# Patient Record
Sex: Female | Born: 1995 | Race: Black or African American | Hispanic: No | Marital: Single | Smoking: Never smoker
Health system: Southern US, Community
[De-identification: ages and names within clinical notes are randomized; demographics above are authoritative.]

## PROBLEM LIST (undated history)

## (undated) DIAGNOSIS — E282 Polycystic ovarian syndrome: Secondary | ICD-10-CM

## (undated) HISTORY — DX: Polycystic ovarian syndrome: E28.2

## (undated) HISTORY — PX: NO PAST SURGERIES: SHX2092

## (undated) HISTORY — PX: TYMPANOSTOMY TUBE PLACEMENT: SHX32

---
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
## 2019-04-11 ENCOUNTER — Emergency Department (HOSPITAL_COMMUNITY)
Admission: EM | Admit: 2019-04-11 | Discharge: 2019-04-11 | Disposition: A | Discharge status: Home / Self Care | Payer: Medicaid Other | Attending: Emergency Medicine | Admitting: Emergency Medicine

## 2019-04-11 ENCOUNTER — Emergency Department (HOSPITAL_COMMUNITY): Payer: Medicaid Other

## 2019-04-11 ENCOUNTER — Other Ambulatory Visit: Payer: Self-pay

## 2019-04-11 ENCOUNTER — Encounter (HOSPITAL_COMMUNITY): Payer: Self-pay | Admitting: *Deleted

## 2019-04-11 DIAGNOSIS — U071 COVID-19: Secondary | ICD-10-CM | POA: Insufficient documentation

## 2019-04-11 DIAGNOSIS — Z20822 Contact with and (suspected) exposure to covid-19: Secondary | ICD-10-CM

## 2019-04-11 DIAGNOSIS — R0602 Shortness of breath: Secondary | ICD-10-CM | POA: Diagnosis present

## 2019-04-11 DIAGNOSIS — J45909 Unspecified asthma, uncomplicated: Secondary | ICD-10-CM | POA: Insufficient documentation

## 2019-04-11 NOTE — ED Provider Notes (Signed)
[facility]-EMERGENCY DEPT Provider Note   CSN: [id number] Arrival date & time: 04/11/19  1709     History Chief Complaint  Patient presents with  . Shortness of Breath    Elaine Gonzales is a 24 y.o. female does observe asthma who presents for evaluation of shortness of breath that has been ongoing for the last 2 to 3 days.  Patient states that she has also had some generalized body aches, fatigue, intermittent headache.  She has not noted any fevers.  She states she occasionally will have some chest tightness that occurs intermittently.  She denies any abdominal pain, nausea/vomiting, diarrhea.  She states she just feels like she has no energy.  She does report that her sister was recently diagnosed with COVID-19.  She started having symptoms this week and patient states she last saw her about 5 to 6 days ago.  Patient states she does have a history of asthma for which she uses albuterol inhaler.  She had to be admitted when she was a child but no recent admissions.  Never had been intubated.  Denies any smoking, history of COPD. She denies any OCP use, recent immobilization, prior history of DVT/PE, recent surgery, leg swelling, or long travel.  The history is provided by the patient.       Past Medical History:  Diagnosis Date  . Asthma     There are no problems to display for this patient.   Past Surgical History:  Procedure Laterality Date  . TYMPANOSTOMY TUBE PLACEMENT       OB History   No obstetric history on file.     No family history on file.  Social History   Tobacco Use  . Smoking status: Never Smoker  . Smokeless tobacco: Never Used  Substance Use Topics  . Alcohol use: Yes  . Drug use: Never    Home Medications Prior to Admission medications   Medication Sig Start Date End Date Taking? Authorizing Provider  albuterol (PROVENTIL HFA;VENTOLIN HFA) 108 (90 BASE) MCG/ACT inhaler Inhale 2 puffs into the lungs every 6 (six)  hours as needed. For breathing    [provider]  diphenhydrAMINE (BENADRYL) 25 MG tablet Take 1 tablet (25 mg total) by mouth every 6 (six) hours as needed for itching. 06/02/15   Barrett Henle, PA-C  fluticasone-salmeterol (ADVAIR HFA) [phone] MCG/ACT inhaler Inhale 2 puffs into the lungs 2 (two) times daily as needed (shortness of breath).    [provider]  hydrOXYzine (VISTARIL) 25 MG capsule Take 1 capsule (25 mg total) by mouth 3 (three) times daily as needed for itching. 06/02/15   Barrett Henle, PA-C  ipratropium (ATROVENT HFA) 17 MCG/ACT inhaler Inhale 2 puffs into the lungs every 6 (six) hours as needed for wheezing.    [provider]  traMADol (ULTRAM) 50 MG tablet Take 1 tablet (50 mg total) by mouth every 6 (six) hours as needed. 07/17/13   Pisciotta, Joni Reining, PA-C    Allergies    Amoxicillin  Review of Systems   Review of Systems  Constitutional: Positive for fatigue. Negative for fever.  Respiratory: Positive for chest tightness and shortness of breath. Negative for cough.   Cardiovascular: Negative for chest pain.  Gastrointestinal: Negative for abdominal pain, nausea and vomiting.  Musculoskeletal: Positive for myalgias.  Neurological: Positive for headaches.  All other systems reviewed and are negative.   Physical Exam Updated Vital Signs BP 137/86   Pulse 76   Temp 98.1  F (36.7 C) (Oral)   Resp (!) 22   Ht 5\' 3"  (1.6 m)   SpO2 97%   BMI 30.11 kg/m   Physical Exam Vitals and nursing note reviewed.  Constitutional:      Appearance: Normal appearance. She is well-developed.     Comments: Sitting comfortably on examination table  HENT:     Head: Normocephalic and atraumatic.  Eyes:     General: Lids are normal.     Conjunctiva/sclera: Conjunctivae normal.     Pupils: Pupils are equal, round, and reactive to light.  Cardiovascular:     Rate and Rhythm: Normal rate and regular rhythm.     Pulses: Normal  pulses.     Heart sounds: Normal heart sounds. No murmur. No friction rub. No gallop.   Pulmonary:     Effort: Pulmonary effort is normal.     Breath sounds: Normal breath sounds.     Comments: Lungs clear to auscultation bilaterally.  Symmetric chest rise.  No wheezing, rales, rhonchi.  No evidence of respiratory distress. Abdominal:     Palpations: Abdomen is soft. Abdomen is not rigid.     Tenderness: There is no abdominal tenderness. There is no guarding.     Comments: Abdomen is soft, non-distended, non-tender. No rigidity, No guarding. No peritoneal signs.  Musculoskeletal:        General: Normal range of motion.     Cervical back: Full passive range of motion without pain.  Skin:    General: Skin is warm and dry.     Capillary Refill: Capillary refill takes less than 2 seconds.  Neurological:     Mental Status: She is alert and oriented to person, place, and time.  Psychiatric:        Speech: Speech normal.     ED Results / Procedures / Treatments   Labs (all labs ordered are listed, but only abnormal results are displayed) Labs Reviewed  SARS CORONAVIRUS 2 (TAT 6-24 HRS)    EKG None  Radiology DG Chest 2 View  Result Date: 04/11/2019 CLINICAL DATA:  Shortness of breath. Exposure to COVID. EXAM: CHEST - 2 VIEW COMPARISON:  Chest x-ray dated 11/26/2009. FINDINGS: Heart size and mediastinal contours are within normal limits. Lungs are clear. No pleural effusion or pneumothorax is seen. Osseous structures about the chest are unremarkable. IMPRESSION: No evidence of acute cardiopulmonary abnormality. Lungs are clear. Electronically Signed   By: Franki Cabot M.D.   On: 04/11/2019 18:01    Procedures Procedures (including critical care time)  Medications Ordered in ED Medications - No data to display  ED Course  I have reviewed the triage vital signs and the nursing notes.  Pertinent labs & imaging results that were available during my care of the patient were  reviewed by me and considered in my medical decision making (see chart for details).    MDM Rules/Calculators/A&P                      24 year old female who presents for evaluation of 2 to 3 days of shortness of breath, generalized body aches, headache, fatigue. Recent COVID-19 exposure. On initially arrival, she is afebrile, nontoxic-appearing. Vital signs are stable. No evidence of hypoxia. Lungs clear to auscultation. She has no PE risk factors. Patient is PERC negative and is low risk for PE. Additionally, her story sounds atypical for ACS etiology. Question infectious process versus COVID-19. Plan for chest x-ray, Covid test.  X-rays reviewed. Negative for any  acute abnormalities.  I personally ambulated patient in the ED room while watching her O2 sats. Her O2 sats maintained between 94-96 on room air with any difficulty.   Discussed chest x-ray with patient. She does have history of asthma. Plan to put her on inhaler. I discussed with her that she has a COVID-19 test pending she should quarantine until it comes back. At this time, vitals are stable. No evidence of hypoxia. Patient stable for discharge. At this time, patient exhibits no emergent life-threatening condition that require further evaluation in ED or admission. Patient had ample opportunity for questions and discussion. All patient's questions were answered with full understanding. Strict return precautions discussed. Patient expresses understanding and agreement to plan.   Portions of this note were generated with Scientist, clinical (histocompatibility and immunogenetics). Dictation errors may occur despite best attempts at proofreading.   Final Clinical Impression(s) / ED Diagnoses Final diagnoses:  Shortness of breath  Suspected COVID-19 virus infection    Rx / DC Orders ED Discharge Orders    None       Rosana Hoes 04/11/19 2306    Rolan Bucco, MD 04/11/19 2342

## 2019-04-11 NOTE — ED Notes (Signed)
Pt ambulated around the room without difficulty and O2 saturations remained WNL.

## 2019-04-11 NOTE — Discharge Instructions (Signed)
As we discussed, your chest x-ray was reassuring.  He has a COVID-19 test pending. You need to quarantine until the test comes back. If you are positive, you will need to quarantine for 2 weeks.  If you are positive, make sure you are staying hydrated and drink plenty of fluids. You need to get plenty of rest.  You can take Tylenol or Ibuprofen as directed for pain. You can alternate Tylenol and Ibuprofen every 4 hours. If you take Tylenol at 1pm, then you can take Ibuprofen at 5pm. Then you can take Tylenol again at 9pm.   Closely monitor your symptoms return the emergency department for any worsening shortness of breath, chest pain, difficulty eating or drinking or any other worsening concerning symptoms.

## 2019-04-11 NOTE — ED Triage Notes (Signed)
Pt states she has been [facility] for 3-4 days, even reg resp in triage with 100% sat on RA

## 2019-04-12 LAB — SARS CORONAVIRUS 2 (TAT 6-24 HRS): SARS Coronavirus 2: POSITIVE — AB

## 2019-05-01 ENCOUNTER — Telehealth: Payer: Medicaid Other

## 2019-05-01 ENCOUNTER — Inpatient Hospital Stay: Admit: 2019-05-01 | Payer: Medicaid Other

## 2019-05-01 DIAGNOSIS — N912 Amenorrhea, unspecified: Secondary | ICD-10-CM | POA: Diagnosis not present

## 2019-05-01 DIAGNOSIS — R102 Pelvic and perineal pain: Secondary | ICD-10-CM | POA: Diagnosis not present

## 2019-05-01 MED ORDER — NAPROXEN 500 MG PO TABS: 500.0000 mg | ORAL_TABLET | Freq: Two times a day (BID) | ORAL | 0 refills | Status: DC

## 2019-05-01 NOTE — ED Provider Notes (Signed)
Virtual Visit via Video Note:  [name]  initiated request for Telemedicine visit with [facility]gent [facility] team. I connected with [name]  on 05/01/2019 at 3:56 PM  for a synchronized telemedicine visit using a video enabled HIPPA compliant telemedicine application. I verified that I am speaking with [name]  using two identifiers. Wallis Bamberg, PA-C  was physically located in a [facility] Urgent care site and Khila A Chim was located at a different location.   The limitations of evaluation and management by telemedicine as well as the availability of in-person appointments were discussed. Patient was informed that she  may incur a bill ( including co-pay) for this virtual visit encounter. [name]  expressed understanding and gave verbal consent to proceed with virtual visit.     History of Present Illness:Elaine Gonzales  is a 24 y.o. female presents with 2 month hx of persistent intermittent pelvic cramping. LMP was June or July 2020. Does not take contraception. Has never had difficulty with her cycles apart from being 1-2 days late.  Has taken multiple UPTs, was negative 2 months ago.   Review of Systems  Constitutional: Negative for chills and fever.  Respiratory: Negative for shortness of breath.   Cardiovascular: Negative for chest pain.  Gastrointestinal: Positive for abdominal pain (pelvic cramping). Negative for nausea and vomiting.  Genitourinary: Negative for dysuria, flank pain, frequency, hematuria and urgency.  Musculoskeletal: Negative for myalgias.  Skin: Negative for rash.  Neurological: Negative for dizziness and headaches.    No current facility-administered medications for this encounter.   Current Outpatient Medications  Medication Sig Dispense Refill  . albuterol (PROVENTIL HFA;VENTOLIN HFA) 108 (90 BASE) MCG/ACT inhaler Inhale 2 puffs into the lungs every 6 (six) hours as needed. For  breathing    . diphenhydrAMINE (BENADRYL) 25 MG tablet Take 1 tablet (25 mg total) by mouth every 6 (six) hours as needed for itching. 20 tablet 0  . fluticasone-salmeterol (ADVAIR HFA) 115-21 MCG/ACT inhaler Inhale 2 puffs into the lungs 2 (two) times daily as needed (shortness of breath).    . hydrOXYzine (VISTARIL) 25 MG capsule Take 1 capsule (25 mg total) by mouth 3 (three) times daily as needed for itching. 21 capsule 0  . ipratropium (ATROVENT HFA) 17 MCG/ACT inhaler Inhale 2 puffs into the lungs every 6 (six) hours as needed for wheezing.    . traMADol (ULTRAM) 50 MG tablet Take 1 tablet (50 mg total) by mouth every 6 (six) hours as needed. 15 tablet 0     Allergies  Allergen Reactions  . Amoxicillin Swelling     Past Medical History:  Diagnosis Date  . Asthma     Past Surgical History:  Procedure Laterality Date  . TYMPANOSTOMY TUBE PLACEMENT       Observations/Objective: Physical Exam Constitutional:      General: She is not in acute distress.    Appearance: Normal appearance. She is well-developed. She is not ill-appearing, toxic-appearing or diaphoretic.  Eyes:     Extraocular Movements: Extraocular movements intact.  Pulmonary:     Effort: Pulmonary effort is normal.  Neurological:     General: No focal deficit present.     Mental Status: She is alert and oriented to person, place, and time.  Psychiatric:        Mood and Affect: Mood normal.        Behavior: Behavior normal.        Thought Content: Thought content  normal.        Judgment: Judgment normal.      Assessment and Plan:  1. Amenorrhea   2. Pelvic cramping     Discussed differential and emphasized need for evaluation with her gynecologist to include imaging of pelvic and transvaginal ultrasound along with lab work and Pap smear.  Provided information to the women's outpatient clinic.  Patient verbalized understanding.  In the meantime she was agreeable to trying 1 more home pregnancy test.  If  negative she can use naproxen for her pelvic cramping.  Also recommended consideration for STI testing. Counseled patient on potential for adverse effects with medications prescribed/recommended today, ER and return-to-clinic precautions discussed, patient verbalized understanding.   Follow Up Instructions:    I discussed the assessment and treatment plan with the patient. The patient was provided an opportunity to ask questions and all were answered. The patient agreed with the plan and demonstrated an understanding of the instructions.   The patient was advised to call back or seek an in-person evaluation if the symptoms worsen or if the condition fails to improve as anticipated.  I provided 15 minutes of non-face-to-face time during this encounter.    Jaynee Eagles, PA-C  05/01/2019 3:56 PM         Jaynee Eagles, PA-C 05/01/19 1608

## 2019-05-07 ENCOUNTER — Ambulatory Visit (INDEPENDENT_AMBULATORY_CARE_PROVIDER_SITE_OTHER)
Admission: EM | Admit: 2019-05-07 | Discharge: 2019-05-07 | Disposition: A | Discharge status: Home / Self Care | Payer: Medicaid Other | Source: Home / Self Care

## 2019-05-07 ENCOUNTER — Ambulatory Visit
Admission: RE | Admit: 2019-05-07 | Discharge: 2019-05-07 | Disposition: A | Discharge status: Home / Self Care | Payer: Medicaid Other | Source: Ambulatory Visit

## 2019-05-07 DIAGNOSIS — N912 Amenorrhea, unspecified: Secondary | ICD-10-CM

## 2019-05-07 DIAGNOSIS — R102 Pelvic and perineal pain: Secondary | ICD-10-CM

## 2019-06-11 ENCOUNTER — Emergency Department (HOSPITAL_COMMUNITY): Payer: Medicaid Other

## 2019-06-11 ENCOUNTER — Emergency Department (HOSPITAL_COMMUNITY)
Admission: EM | Admit: 2019-06-11 | Discharge: 2019-06-11 | Disposition: A | Discharge status: Home / Self Care | Payer: Medicaid Other | Attending: Emergency Medicine | Admitting: Emergency Medicine

## 2019-06-11 ENCOUNTER — Encounter (HOSPITAL_COMMUNITY): Payer: Self-pay | Admitting: Emergency Medicine

## 2019-06-11 ENCOUNTER — Other Ambulatory Visit: Payer: Self-pay

## 2019-06-11 DIAGNOSIS — R079 Chest pain, unspecified: Secondary | ICD-10-CM | POA: Insufficient documentation

## 2019-06-11 DIAGNOSIS — Z79899 Other long term (current) drug therapy: Secondary | ICD-10-CM | POA: Diagnosis not present

## 2019-06-11 DIAGNOSIS — Z20822 Contact with and (suspected) exposure to covid-19: Secondary | ICD-10-CM | POA: Insufficient documentation

## 2019-06-11 LAB — CBC WITH DIFFERENTIAL/PLATELET
Abs Immature Granulocytes: 0.1 10*3/uL — ABNORMAL HIGH (ref 0.00–0.07)
Basophils Absolute: 0 10*3/uL (ref 0.0–0.1)
Basophils Relative: 0 %
Eosinophils Absolute: 0.2 10*3/uL (ref 0.0–0.5)
Eosinophils Relative: 1 %
HCT: 44.1 % (ref 36.0–46.0)
Hemoglobin: 13.3 g/dL (ref 12.0–15.0)
Immature Granulocytes: 1 %
Lymphocytes Relative: 7 %
Lymphs Abs: 1.2 10*3/uL (ref 0.7–4.0)
MCH: 26 pg (ref 26.0–34.0)
MCHC: 30.2 g/dL (ref 30.0–36.0)
MCV: 86.1 fL (ref 80.0–100.0)
Monocytes Absolute: 0.9 10*3/uL (ref 0.1–1.0)
Monocytes Relative: 5 %
Neutro Abs: 15.7 10*3/uL — ABNORMAL HIGH (ref 1.7–7.7)
Neutrophils Relative %: 86 %
Platelets: 328 10*3/uL (ref 150–400)
RBC: 5.12 MIL/uL — ABNORMAL HIGH (ref 3.87–5.11)
RDW: 13.7 % (ref 11.5–15.5)
WBC: 18.1 10*3/uL — ABNORMAL HIGH (ref 4.0–10.5)
nRBC: 0 % (ref 0.0–0.2)

## 2019-06-11 LAB — COMPREHENSIVE METABOLIC PANEL
ALT: 19 U/L (ref 0–44)
AST: 18 U/L (ref 15–41)
Albumin: 4 g/dL (ref 3.5–5.0)
Alkaline Phosphatase: 59 U/L (ref 38–126)
Anion gap: 11 (ref 5–15)
BUN: 16 mg/dL (ref 6–20)
CO2: 24 mmol/L (ref 22–32)
Calcium: 9.3 mg/dL (ref 8.9–10.3)
Chloride: 102 mmol/L (ref 98–111)
Creatinine, Ser: 0.89 mg/dL (ref 0.44–1.00)
GFR calc Af Amer: >60 mL/min (ref >60)
GFR calc non Af Amer: >60 mL/min (ref >60)
Glucose, Bld: 125 mg/dL — ABNORMAL HIGH (ref 70–99)
Potassium: 4 mmol/L (ref 3.5–5.1)
Sodium: 137 mmol/L (ref 135–145)
Total Bilirubin: 0.6 mg/dL (ref 0.3–1.2)
Total Protein: 8.1 g/dL (ref 6.5–8.1)

## 2019-06-11 LAB — URINALYSIS, ROUTINE W REFLEX MICROSCOPIC
Bilirubin Urine: NEGATIVE
Glucose, UA: NEGATIVE mg/dL
Hgb urine dipstick: NEGATIVE
Ketones, ur: NEGATIVE mg/dL
Leukocytes,Ua: NEGATIVE
Nitrite: NEGATIVE
Protein, ur: NEGATIVE mg/dL
Specific Gravity, Urine: 1.017 (ref 1.005–1.030)
pH: 6 (ref 5.0–8.0)

## 2019-06-11 LAB — D-DIMER, QUANTITATIVE: D-Dimer, Quant: 0.42 ug/mL-FEU (ref 0.00–0.50)

## 2019-06-11 LAB — LACTIC ACID, PLASMA: Lactic Acid, Venous: 1.9 mmol/L (ref 0.5–1.9)

## 2019-06-11 LAB — TROPONIN I (HIGH SENSITIVITY): Troponin I (High Sensitivity): <2 ng/L (ref <18)

## 2019-06-11 LAB — I-STAT BETA HCG BLOOD, ED (MC, WL, AP ONLY): I-stat hCG, quantitative: <5 m[IU]/mL (ref <5)

## 2019-06-11 LAB — SARS CORONAVIRUS 2 (TAT 6-24 HRS): SARS Coronavirus 2: NEGATIVE

## 2019-06-11 MED ORDER — FENTANYL CITRATE (PF) 100 MCG/2ML IJ SOLN
50.0000 ug | Freq: Once | INTRAMUSCULAR | Status: AC
  Administered 2019-06-11: 50 ug via INTRAVENOUS
  Filled 2019-06-11: qty 2

## 2019-06-11 MED ORDER — SODIUM CHLORIDE 0.9 % IV BOLUS
1000.0000 mL | Freq: Once | INTRAVENOUS | Status: AC
  Administered 2019-06-11: 1000 mL via INTRAVENOUS

## 2019-06-11 MED ORDER — ACETAMINOPHEN 325 MG PO TABS
650.0000 mg | ORAL_TABLET | Freq: Once | ORAL | Status: AC
  Administered 2019-06-11: 650 mg via ORAL
  Filled 2019-06-11: qty 2

## 2019-06-11 NOTE — ED Triage Notes (Signed)
Pt c/o chest tightness that started when she woke up this morning. Hx asthma.

## 2019-06-11 NOTE — Discharge Instructions (Signed)
Recommend follow-up with primary doctor regarding the symptoms you are experiencing today.  Discussed her elevated white blood cell count with them, likely need repeat blood work at some point in the next couple weeks.  Recommend following isolation precautions until we have the results of your COVID-19 test.  Return to ER if you develop worsening chest pain, difficulty breathing, fever, vomiting or other concerns symptom.

## 2019-06-11 NOTE — ED Provider Notes (Signed)
[facility]-EMERGENCY DEPT Provider Note   CSN: [id number] Arrival date & time: 06/11/19  0749     History Chief Complaint  Patient presents with  . Chest Pain    Elaine Gonzales is a 24 y.o. female.  Presents to ER chief complaint chest pain.  Yesterday, normal state of health, not have any symptoms.  Woke up early this morning having constant chest pain.  Pain is up to 9-10 severity, now 7 out of 10.  Occurring at rest, not associated with exertion.  Over the past few weeks she has not had any other chest pains.  She denies associated shortness of breath.  No cough, no fever at home, noted to be borderline febrile in ER.  No abdominal pain, no vomiting, no dysuria or hematuria.  Chart review, COVID-19 in January.  Has history of asthma, denies prior history CAD, hyperlipidemia, diabetes, hypertension, family history early CAD, DVT/PE.  HPI     Past Medical History:  Diagnosis Date  . Asthma     There are no problems to display for this patient.   Past Surgical History:  Procedure Laterality Date  . TYMPANOSTOMY TUBE PLACEMENT       OB History   No obstetric history on file.     No family history on file.  Social History   Tobacco Use  . Smoking status: Never Smoker  . Smokeless tobacco: Never Used  Substance Use Topics  . Alcohol use: Yes  . Drug use: Never    Home Medications Prior to Admission medications   Medication Sig Start Date End Date Taking? Authorizing Provider  acetaminophen (TYLENOL) 325 MG tablet Take 650 mg by mouth every 6 (six) hours as needed for mild pain or headache.   Yes [provider]  diphenhydrAMINE (BENADRYL) 25 MG tablet Take 1 tablet (25 mg total) by mouth every 6 (six) hours as needed for itching. Patient not taking: Reported on 06/11/2019 06/02/15   Barrett Henle, PA-C  hydrOXYzine (VISTARIL) 25 MG capsule Take 1 capsule (25 mg total) by mouth 3 (three) times daily as needed for  itching. Patient not taking: Reported on 06/11/2019 06/02/15   Barrett Henle, PA-C  naproxen (NAPROSYN) 500 MG tablet Take 1 tablet (500 mg total) by mouth 2 (two) times daily. Patient not taking: Reported on 06/11/2019 05/01/19   Wallis Bamberg, PA-C  traMADol (ULTRAM) 50 MG tablet Take 1 tablet (50 mg total) by mouth every 6 (six) hours as needed. Patient not taking: Reported on 06/11/2019 07/17/13   Pisciotta, Joni Reining, PA-C    Allergies    Penicillins and Amoxicillin  Review of Systems   Review of Systems  Constitutional: Negative for chills and fever.  HENT: Negative for ear pain and sore throat.   Eyes: Negative for pain and visual disturbance.  Respiratory: Negative for cough and shortness of breath.   Cardiovascular: Positive for chest pain. Negative for palpitations.  Gastrointestinal: Negative for abdominal pain and vomiting.  Genitourinary: Negative for dysuria and hematuria.  Musculoskeletal: Negative for arthralgias and back pain.  Skin: Negative for color change and rash.  Neurological: Negative for seizures and syncope.  All other systems reviewed and are negative.   Physical Exam Updated Vital Signs BP 120/70   Pulse (!) 112   Temp (!) 100.4 F (38 C) (Oral)   Resp (!) 22   Ht 5\' 3"  (1.6 m)   Wt (!) 136.2 kg   LMP 06/04/2019 (Approximate)   SpO2 100%  BMI 53.20 kg/m   Physical Exam Vitals and nursing note reviewed.  Constitutional:      General: She is not in acute distress.    Appearance: She is well-developed.  HENT:     Head: Normocephalic and atraumatic.  Eyes:     Conjunctiva/sclera: Conjunctivae normal.  Cardiovascular:     Rate and Rhythm: Regular rhythm. Tachycardia present.     Heart sounds: No murmur.  Pulmonary:     Effort: Pulmonary effort is normal. No respiratory distress.     Breath sounds: Normal breath sounds.  Abdominal:     Palpations: Abdomen is soft.     Tenderness: There is no abdominal tenderness.  Musculoskeletal:      Cervical back: Neck supple.  Skin:    General: Skin is warm and dry.     Capillary Refill: Capillary refill takes less than 2 seconds.  Neurological:     General: No focal deficit present.     Mental Status: She is alert and oriented to person, place, and time.     ED Results / Procedures / Treatments   Labs (all labs ordered are listed, but only abnormal results are displayed) Labs Reviewed  CBC WITH DIFFERENTIAL/PLATELET - Abnormal; Notable for the following components:      Result Value   WBC 18.1 (*)    RBC 5.12 (*)    Neutro Abs 15.7 (*)    Abs Immature Granulocytes 0.10 (*)    All other components within normal limits  COMPREHENSIVE METABOLIC PANEL - Abnormal; Notable for the following components:   Glucose, Bld 125 (*)    All other components within normal limits  CULTURE, BLOOD (ROUTINE X 2)  CULTURE, BLOOD (ROUTINE X 2)  SARS CORONAVIRUS 2 (TAT 6-24 HRS)  LACTIC ACID, PLASMA  D-DIMER, QUANTITATIVE (NOT AT [facility])  LACTIC ACID, PLASMA  URINALYSIS, ROUTINE W REFLEX MICROSCOPIC  I-STAT BETA HCG BLOOD, ED ([facility], [facility], [facility] ONLY)  TROPONIN I (HIGH SENSITIVITY)  TROPONIN I (HIGH SENSITIVITY)    EKG EKG Interpretation  Date/Time:  Thursday June 11 2019 08:04:05 EDT Ventricular Rate:  157 PR Interval:  96 QRS Duration: 72 QT Interval:  256 QTC Calculation: 413 R Axis:   58 Text Interpretation: Sinus tachycardia with short PR Septal infarct , age undetermined Abnormal ECG rate is faster compared to Jan 2021 Confirmed by Sherwood Gambler [phone]) on 06/11/2019 8:08:04 AM   Radiology DG Chest Port 1 View  Result Date: 06/11/2019 CLINICAL DATA:  Shortness of breath and chest tightness EXAM: PORTABLE CHEST 1 VIEW COMPARISON:  April 11, 2019 FINDINGS: Lungs are clear. Heart size and pulmonary vascularity are within normal limits. No adenopathy. No pneumothorax. No bone lesions. IMPRESSION: Lungs clear.  Stable cardiac silhouette. Electronically Signed   By: Lowella Grip  III M.D.   On: 06/11/2019 08:45    Procedures Procedures (including critical care time)  Medications Ordered in ED Medications  sodium chloride 0.9 % bolus 1,000 mL (1,000 mLs Intravenous New Bag/Given 06/11/19 0900)  acetaminophen (TYLENOL) tablet 650 mg (650 mg Oral Given 06/11/19 0855)  fentaNYL (SUBLIMAZE) injection 50 mcg (50 mcg Intravenous Given 06/11/19 0914)    ED Course  I have reviewed the triage vital signs and the nursing notes.  Pertinent labs & imaging results that were available during my care of the patient were reviewed by me and considered in my medical decision making (see chart for details).    MDM Rules/Calculators/A&P  24 year old female with past medical history asthma otherwise healthy presents to ER with chest pain.  Here patient noted to be well-appearing, but had borderline fever, significant tachycardia.  EKG showed sinus tachycardia, no acute ischemic changes were noted.  Troponin undetectable.  Given patient's description of symptoms, EKG and troponin findings, very low suspicion for ACS.  Given recent Covid and vital signs, checked D-dimer, within normal limits, no hypoxia or shortness of breath, therefore doubt pulmonary embolism.  CXR negative.  UA negative. Labs with leukocytosis but otherwise wnl. Patient was provided fluids, antipyretics, single dose fentanyl.  Her symptoms completely resolved and she remained symptom-free while observed in ER.  Suspect patient may have acute viral illness.  Though lower suspicion, will send Covid test.  Reviewed return precautions with patient, discharged home.  After the discussed management above, the patient was determined to be safe for discharge.  The patient was in agreement with this plan and all questions regarding their care were answered.  ED return precautions were discussed and the patient will return to the ED with any significant worsening of condition.  Final Clinical Impression(s) / ED  Diagnoses Final diagnoses:  Chest pain, unspecified type    Rx / DC Orders ED Discharge Orders    None       Milagros Loll, MD 06/11/19 1249

## 2019-06-16 LAB — CULTURE, BLOOD (ROUTINE X 2)
Culture: NO GROWTH
Culture: NO GROWTH
Special Requests: ADEQUATE

## 2019-10-24 ENCOUNTER — Ambulatory Visit (HOSPITAL_COMMUNITY): Payer: Self-pay

## 2019-10-26 ENCOUNTER — Ambulatory Visit: Payer: Self-pay

## 2019-12-03 ENCOUNTER — Ambulatory Visit: Payer: Self-pay

## 2019-12-17 ENCOUNTER — Ambulatory Visit: Payer: Self-pay

## 2019-12-30 ENCOUNTER — Ambulatory Visit: Payer: Medicaid Other | Admitting: Emergency Medicine

## 2019-12-30 ENCOUNTER — Ambulatory Visit: Payer: Self-pay

## 2019-12-31 ENCOUNTER — Encounter: Payer: Self-pay | Admitting: Emergency Medicine

## 2020-01-02 ENCOUNTER — Ambulatory Visit: Payer: Self-pay

## 2020-09-23 LAB — HM PAP SMEAR: HM Pap smear: NORMAL

## 2020-09-25 LAB — HM HEPATITIS C SCREENING LAB: HM Hepatitis Screen: NEGATIVE

## 2020-09-25 LAB — HM HIV SCREENING LAB: HM HIV Screening: NEGATIVE

## 2020-09-25 LAB — TSH: TSH: 3 (ref 0.41–5.90)

## 2020-09-25 LAB — HEMOGLOBIN A1C: Hemoglobin A1C: 5.8

## 2021-01-10 ENCOUNTER — Encounter: Payer: Medicaid Other | Admitting: Family Medicine

## 2021-04-12 ENCOUNTER — Telehealth: Payer: Medicaid Other | Admitting: Nurse Practitioner

## 2021-04-12 ENCOUNTER — Telehealth: Payer: Medicaid Other

## 2021-04-12 DIAGNOSIS — K047 Periapical abscess without sinus: Secondary | ICD-10-CM

## 2021-04-12 MED ORDER — IBUPROFEN 600 MG PO TABS: 600.0000 mg | ORAL_TABLET | Freq: Three times a day (TID) | ORAL | 0 refills | Status: DC | PRN

## 2021-04-12 MED ORDER — CLINDAMYCIN HCL 300 MG PO CAPS: 300.0000 mg | ORAL_CAPSULE | Freq: Three times a day (TID) | ORAL | 0 refills | Status: AC

## 2021-04-12 NOTE — Patient Instructions (Signed)
° ° °  It is imperative that you see a dentist within 10 days of this Visit to determine the cause of the dental pain and be sure it is adequately treated  A toothache or tooth pain is caused when the nerve in the root of a tooth or surrounding a tooth is irritated. Dental (tooth) infection, decay, injury, or loss of a tooth are the most common causes of dental pain. Pain may also occur after an extraction (tooth is pulled out). Pain sometimes originates from other areas and radiates to the jaw, thus appearing to be tooth pain.Bacteria growing inside your mouth can contribute to gum disease and dental decay, both of which can cause pain. A toothache occurs from inflammation of the central portion of the tooth called pulp. The pulp contains nerve endings that are very sensitive to pain. Inflammation to the pulp or pulpitis may be caused by dental cavities, trauma, and infection.    HOME CARE:   For toothaches: Over-the-counter pain medications such as acetaminophen or ibuprofen may be used. Take these as directed on the package while you arrange for a dental appointment. Avoid very cold or hot foods, because they may make the pain worse. You may get relief from biting on a cotton ball soaked in oil of cloves. You can get oil of cloves at most drug stores.  For jaw pain:  If pain happens every time you open your mouth widely, the temporomandibular joint (TMJ) may be the source of the pain. Yawning or taking a large bite of food may worsen the pain. An appointment with your doctor or dentist will help you find the cause.     GET HELP RIGHT AWAY IF:  You have a high fever or chills If you have had a recent head or face injury and develop headache, light headedness, nausea, vomiting, or other symptoms that concern you after an injury to your face or mouth, you could have a more serious injury in addition to your dental injury. A facial rash associated with a toothache: This condition may improve with  medication. Contact your doctor for them to decide what is appropriate. Any jaw pain occurring with chest pain: Although jaw pain is most commonly caused by dental disease, it is sometimes referred pain from other areas. People with heart disease, especially people who have had stents placed, people with diabetes, or those who have had heart surgery may have jaw pain as a symptom of heart attack or angina. If your jaw or tooth pain is associated with lightheadedness, sweating, or shortness of breath, you should see a doctor as soon as possible. Trouble swallowing or excessive pain or bleeding from gums: If you have a history of a weakened immune system, diabetes, or steroid use, you may be more susceptible to infections. Infections can often be more severe and extensive or caused by unusual organisms. Dental and gum infections in people with these conditions may require more aggressive treatment. An abscess may need draining or IV antibiotics, for example.  MAKE SURE YOU   Understand these instructions. Will watch your condition. Will get help right away if you are not doing well or get worse.

## 2021-04-12 NOTE — Progress Notes (Signed)
Virtual Visit Consent   [name], you are scheduled for a virtual visit with a [facility] provider today.     Just as with appointments in the office, your consent must be obtained to participate.  Your consent will be active for this visit and any virtual visit you may have with one of our providers in the next 365 days.     If you have a MyChart account, a copy of this consent can be sent to you electronically.  All virtual visits are billed to your insurance company just like a traditional visit in the office.    As this is a virtual visit, video technology does not allow for your provider to perform a traditional examination.  This may limit your provider's ability to fully assess your condition.  If your provider identifies any concerns that need to be evaluated in person or the need to arrange testing (such as labs, EKG, etc.), we will make arrangements to do so.     Although advances in technology are sophisticated, we cannot ensure that it will always work on either your end or our end.  If the connection with a video visit is poor, the visit may have to be switched to a telephone visit.  With either a video or telephone visit, we are not always able to ensure that we have a secure connection.     I need to obtain your verbal consent now.   Are you willing to proceed with your visit today?    Elaine Gonzales has provided verbal consent on 04/12/2021 for a virtual visit (video or telephone).   Viviano Simas, FNP   Date: 04/12/2021 7:30 PM   Virtual Visit via Video Note   I, Viviano Simas, connected with  Elaine Gonzales  ([id number], March 07, 2021) on 04/12/21 at  7:30 PM EST by a video-enabled telemedicine application and verified that I am speaking with the correct person using two identifiers.  Location: Patient: Virtual Visit Location Patient: Home Provider: Virtual Visit Location Provider: Home Office   I discussed the limitations of evaluation and  management by telemedicine and the availability of in person appointments. The patient expressed understanding and agreed to proceed.    History of Present Illness: Elaine Gonzales is a 26 y.o. who identifies as a female who was assigned female at birth, and is being seen today with complaints of jaw pain that radiates to her ear for the past 3 nights.  When she talks the pain increases.  She denies grinding her teeth.   She feels increased sensitivity when air hits her teeth or when she chews.  She denies a fever.   She has not been to a dentist in a "long time" Waiting for insurance to start on the first of next month   Problems: There are no problems to display for this patient.   Allergies:  Allergies  Allergen Reactions   Penicillins Anaphylaxis   Amoxicillin Swelling   Medications:  Current Outpatient Medications:    acetaminophen (TYLENOL) 325 MG tablet, Take 650 mg by mouth every 6 (six) hours as needed for mild pain or headache., Disp: , Rfl:    diphenhydrAMINE (BENADRYL) 25 MG tablet, Take 1 tablet (25 mg total) by mouth every 6 (six) hours as needed for itching. (Patient not taking: Reported on 06/11/2019), Disp: 20 tablet, Rfl: 0   hydrOXYzine (VISTARIL) 25 MG capsule, Take 1 capsule (25 mg total) by mouth 3 (three) times daily as needed for  itching. (Patient not taking: Reported on 06/11/2019), Disp: 21 capsule, Rfl: 0   naproxen (NAPROSYN) 500 MG tablet, Take 1 tablet (500 mg total) by mouth 2 (two) times daily. (Patient not taking: Reported on 06/11/2019), Disp: 30 tablet, Rfl: 0   traMADol (ULTRAM) 50 MG tablet, Take 1 tablet (50 mg total) by mouth every 6 (six) hours as needed. (Patient not taking: Reported on 06/11/2019), Disp: 15 tablet, Rfl: 0  Observations/Objective: Patient is well-developed, well-nourished in no acute distress.  Resting comfortably at home.  Head is normocephalic, atraumatic.  No labored breathing.  Speech is clear and coherent with logical  content.  Patient is alert and oriented at baseline.    Assessment and Plan: 1. Dental infection  - clindamycin (CLEOCIN) 300 MG capsule; Take 1 capsule (300 mg total) by mouth 3 (three) times daily for 10 days.  Dispense: 30 capsule; Refill: 0 - ibuprofen (ADVIL) 600 MG tablet; Take 1 tablet (600 mg total) by mouth every 8 (eight) hours as needed.  Dispense: 30 tablet; Refill: 0     Follow Up Instructions: I discussed the assessment and treatment plan with the patient. The patient was provided an opportunity to ask questions and all were answered. The patient agreed with the plan and demonstrated an understanding of the instructions.  A copy of instructions were sent to the patient via MyChart unless otherwise noted below.    The patient was advised to call back or seek an in-person evaluation if the symptoms worsen or if the condition fails to improve as anticipated.  Time:  I spent 10 minutes with the patient via telehealth technology discussing the above problems/concerns.    Viviano Simas, FNP

## 2021-05-03 IMAGING — DX DG CHEST 1V PORT
1 series · 1 of 1 positions shown · non-contrast
Comparison: April 11, 2019

CLINICAL DATA: Shortness of breath and chest tightness

EXAM:
PORTABLE CHEST 1 VIEW

[chest ap]
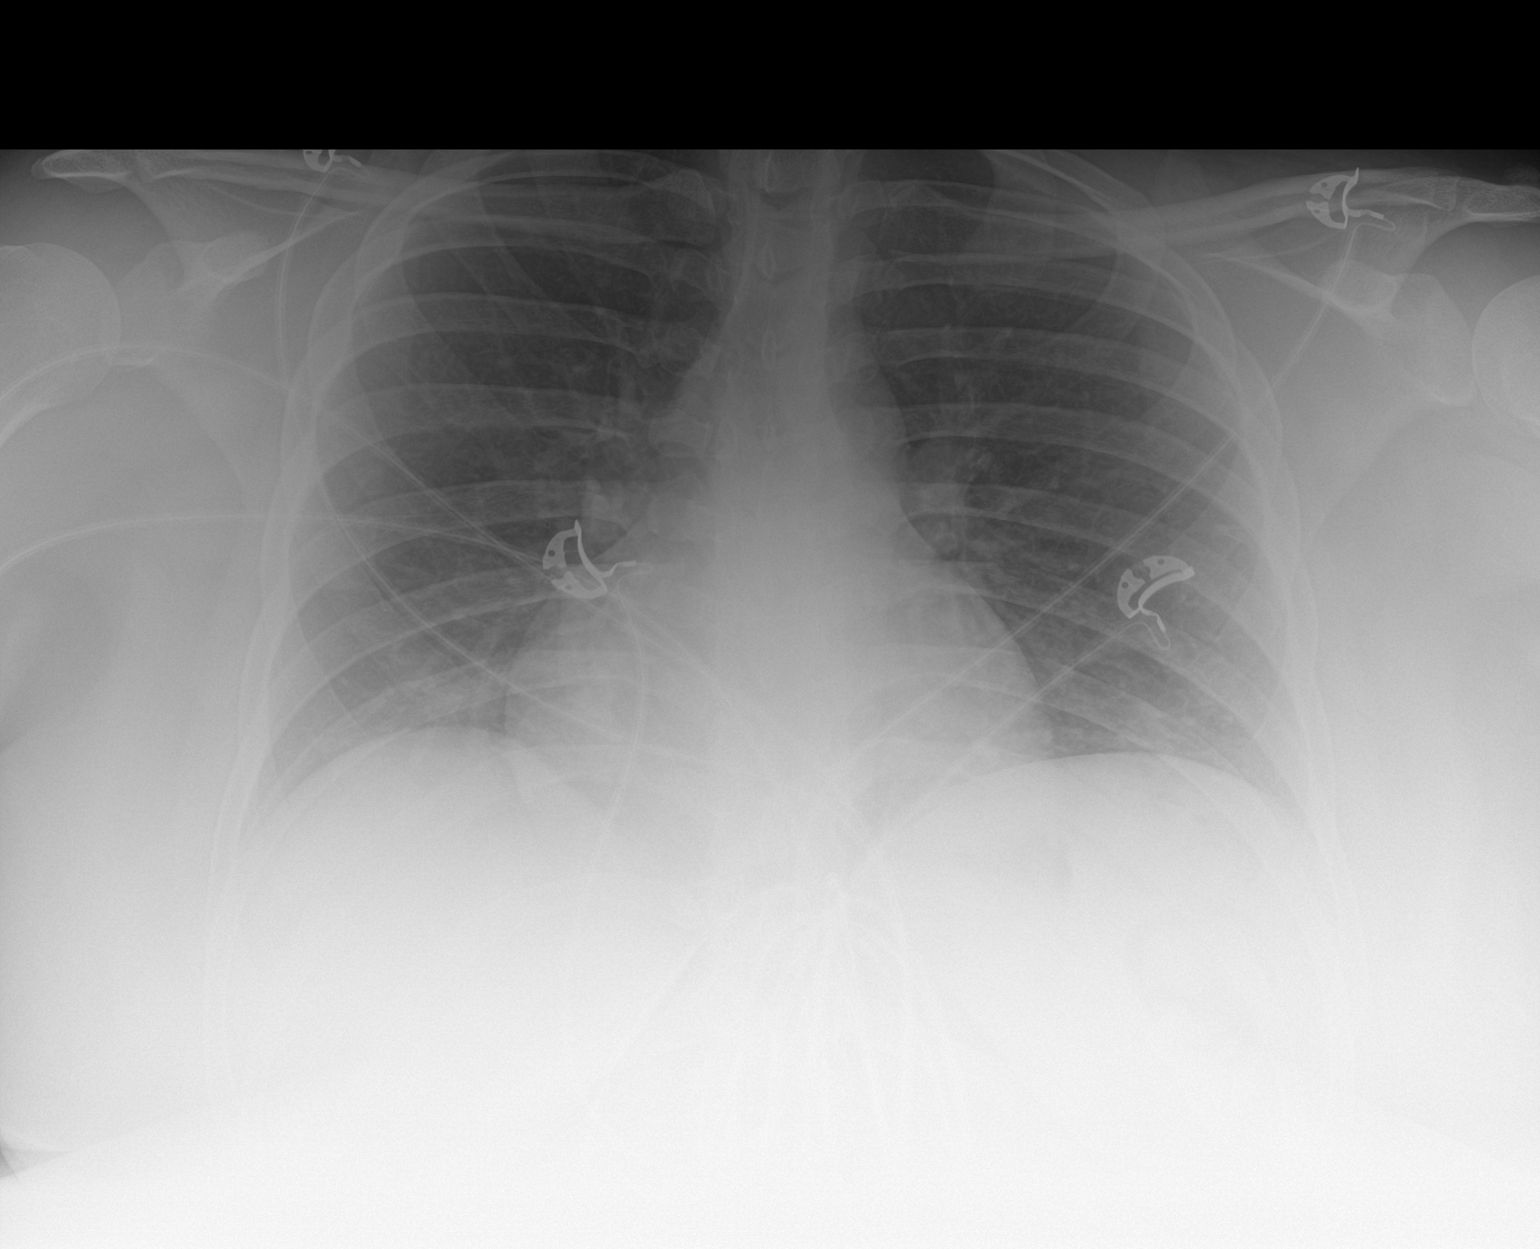

[1 of 1 positions shown; findings below may reference images not displayed]

FINDINGS: Lungs are clear. Heart size and pulmonary vascularity are within
normal limits. No adenopathy. No pneumothorax. No bone lesions.
IMPRESSION: Lungs clear.  Stable cardiac silhouette.

## 2021-06-09 ENCOUNTER — Encounter: Payer: Self-pay | Admitting: Obstetrics and Gynecology

## 2021-08-02 ENCOUNTER — Encounter: Payer: Managed Care, Other (non HMO) | Admitting: Radiology

## 2021-08-02 DIAGNOSIS — Z0289 Encounter for other administrative examinations: Secondary | ICD-10-CM

## 2021-08-08 NOTE — Progress Notes (Unsigned)
   New Patient Office Visit  Subjective    Patient ID: Elaine Gonzales, female    DOB: [birth year]  Age: 26 y.o. MRN: [id number]  CC: No chief complaint on file.   HPI Elaine Gonzales presents for new patient visit to establish care.  Introduced to Publishing rights manager role and practice setting.  All questions answered.  Discussed provider/patient relationship and expectations.   Outpatient Encounter Medications as of 08/09/2021  Medication Sig   acetaminophen (TYLENOL) 325 MG tablet Take 650 mg by mouth every 6 (six) hours as needed for mild pain or headache.   diphenhydrAMINE (BENADRYL) 25 MG tablet Take 1 tablet (25 mg total) by mouth every 6 (six) hours as needed for itching. (Patient not taking: Reported on 06/11/2019)   hydrOXYzine (VISTARIL) 25 MG capsule Take 1 capsule (25 mg total) by mouth 3 (three) times daily as needed for itching. (Patient not taking: Reported on 06/11/2019)   ibuprofen (ADVIL) 600 MG tablet Take 1 tablet (600 mg total) by mouth every 8 (eight) hours as needed.   naproxen (NAPROSYN) 500 MG tablet Take 1 tablet (500 mg total) by mouth 2 (two) times daily. (Patient not taking: Reported on 06/11/2019)   traMADol (ULTRAM) 50 MG tablet Take 1 tablet (50 mg total) by mouth every 6 (six) hours as needed. (Patient not taking: Reported on 06/11/2019)   No facility-administered encounter medications on file as of 08/09/2021.    Past Medical History:  Diagnosis Date   Asthma     Past Surgical History:  Procedure Laterality Date   TYMPANOSTOMY TUBE PLACEMENT      No family history on file.  Social History   Socioeconomic History   Marital status: Single    Spouse name: Not on file   Number of children: Not on file   Years of education: Not on file   Highest education level: Not on file  Occupational History   Not on file  Tobacco Use   Smoking status: Never   Smokeless tobacco: Never  Substance and Sexual Activity   Alcohol use: Yes   Drug  use: Never   Sexual activity: Not on file  Other Topics Concern   Not on file  Social History Narrative   Not on file   Social Determinants of Health   Financial Resource Strain: Not on file  Food Insecurity: Not on file  Transportation Needs: Not on file  Physical Activity: Not on file  Stress: Not on file  Social Connections: Not on file  Intimate Partner Violence: Not on file    ROS      Objective    There were no vitals taken for this visit.  Physical Exam  {Labs (Optional):23779}    Assessment & Plan:   Problem List Items Addressed This Visit   None   No follow-ups on file.   Gerre Scull, NP

## 2021-08-09 ENCOUNTER — Ambulatory Visit (INDEPENDENT_AMBULATORY_CARE_PROVIDER_SITE_OTHER): Payer: Managed Care, Other (non HMO) | Admitting: Nurse Practitioner

## 2021-08-09 ENCOUNTER — Encounter: Payer: Self-pay | Admitting: Nurse Practitioner

## 2021-08-09 VITALS — BP 133/67 | HR 78 | Temp 97.2°F | Ht 62.5 in | Wt 294.0 lb

## 2021-08-09 DIAGNOSIS — R7303 Prediabetes: Secondary | ICD-10-CM

## 2021-08-09 DIAGNOSIS — J4520 Mild intermittent asthma, uncomplicated: Secondary | ICD-10-CM

## 2021-08-09 DIAGNOSIS — Z13220 Encounter for screening for lipoid disorders: Secondary | ICD-10-CM | POA: Diagnosis not present

## 2021-08-09 DIAGNOSIS — Z136 Encounter for screening for cardiovascular disorders: Secondary | ICD-10-CM

## 2021-08-09 DIAGNOSIS — G5711 Meralgia paresthetica, right lower limb: Secondary | ICD-10-CM

## 2021-08-09 DIAGNOSIS — Z23 Encounter for immunization: Secondary | ICD-10-CM | POA: Diagnosis not present

## 2021-08-09 DIAGNOSIS — E282 Polycystic ovarian syndrome: Secondary | ICD-10-CM | POA: Diagnosis not present

## 2021-08-09 MED ORDER — GABAPENTIN 300 MG PO CAPS: 300.0000 mg | ORAL_CAPSULE | Freq: Every day | ORAL | 1 refills | Status: DC

## 2021-08-09 MED ORDER — ALBUTEROL SULFATE HFA 108 (90 BASE) MCG/ACT IN AERS: 2.0000 | INHALATION_SPRAY | Freq: Four times a day (QID) | RESPIRATORY_TRACT | 3 refills | Status: AC | PRN

## 2021-08-09 NOTE — Assessment & Plan Note (Signed)
Last A1c 09/25/2020 was 5.8%.  We will recheck A1c today

## 2021-08-09 NOTE — Assessment & Plan Note (Signed)
Chronic, ongoing for the last year.  However now the numbness and tingling is there constantly for the last 3 months.  She walks frequently and stands at work.  Discussed using a mask when she is standing at work and making sure she is wearing good shoes.  Discussed weight loss.  We will also start gabapentin 300 mg at bedtime.  Check vitamin B12 today.  Follow-up in 4 weeks.

## 2021-08-09 NOTE — Patient Instructions (Signed)
It was great to see you!  Let's start gabapentin 1 capsule at bedtime for the numbness in your leg (paraesthetica meralgia).   We are checking your labs today and will let you know the results via mychart/phone.   Let's follow-up in 4 weeks, sooner if you have concerns.  If a referral was placed today, you will be contacted for an appointment. Please note that routine referrals can sometimes take up to 3-4 weeks to process. Please call our office if you haven't heard anything after this time frame.  Take care,  Rodman Pickle, NP

## 2021-08-09 NOTE — Assessment & Plan Note (Signed)
Chronic, intermittent.  She states that it tends to worsen during the spring with pollen and if she gets an illness.  She is having some wheezing today on exam, she is just getting over a cold.  We will send in albuterol inhaler as needed for shortness of breath or wheezing.

## 2021-08-09 NOTE — Assessment & Plan Note (Signed)
She was diagnosed by her GYN about a year ago.  She was taking metformin 500 mg however it caused her severe diarrhea and she was unable to tolerate it.  Continue collaboration with GYN.

## 2021-08-10 LAB — COMPREHENSIVE METABOLIC PANEL
ALT: 19 U/L (ref 0–35)
AST: 20 U/L (ref 0–37)
Albumin: 4.3 g/dL (ref 3.5–5.2)
Alkaline Phosphatase: 51 U/L (ref 39–117)
BUN: 15 mg/dL (ref 6–23)
CO2: 25 mEq/L (ref 19–32)
Calcium: 9.7 mg/dL (ref 8.4–10.5)
Chloride: 103 mEq/L (ref 96–112)
Creatinine, Ser: 0.81 mg/dL (ref 0.40–1.20)
GFR: 101.71 mL/min (ref >60.00)
Glucose, Bld: 77 mg/dL (ref 70–99)
Potassium: 3.8 mEq/L (ref 3.5–5.1)
Sodium: 138 mEq/L (ref 135–145)
Total Bilirubin: 0.3 mg/dL (ref 0.2–1.2)
Total Protein: 7.7 g/dL (ref 6.0–8.3)

## 2021-08-10 LAB — CBC WITH DIFFERENTIAL/PLATELET
Basophils Absolute: 0.1 10*3/uL (ref 0.0–0.1)
Basophils Relative: 0.7 % (ref 0.0–3.0)
Eosinophils Absolute: 0.6 10*3/uL (ref 0.0–0.7)
Eosinophils Relative: 9.2 % — ABNORMAL HIGH (ref 0.0–5.0)
HCT: 38.3 % (ref 36.0–46.0)
Hemoglobin: 12.2 g/dL (ref 12.0–15.0)
Lymphocytes Relative: 34.4 % (ref 12.0–46.0)
Lymphs Abs: 2.4 10*3/uL (ref 0.7–4.0)
MCHC: 31.9 g/dL (ref 30.0–36.0)
MCV: 81.4 fl (ref 78.0–100.0)
Monocytes Absolute: 0.6 10*3/uL (ref 0.1–1.0)
Monocytes Relative: 8.9 % (ref 3.0–12.0)
Neutro Abs: 3.3 10*3/uL (ref 1.4–7.7)
Neutrophils Relative %: 46.8 % (ref 43.0–77.0)
Platelets: 299 10*3/uL (ref 150.0–400.0)
RBC: 4.7 Mil/uL (ref 3.87–5.11)
RDW: 13.8 % (ref 11.5–15.5)
WBC: 7 10*3/uL (ref 4.0–10.5)

## 2021-08-10 LAB — LIPID PANEL
Cholesterol: 205 mg/dL — ABNORMAL HIGH (ref 0–200)
HDL: 55.6 mg/dL (ref >39.00)
LDL Cholesterol: 134 mg/dL — ABNORMAL HIGH (ref 0–99)
NonHDL: 149.72
Total CHOL/HDL Ratio: 4
Triglycerides: 77 mg/dL (ref 0.0–149.0)
VLDL: 15.4 mg/dL (ref 0.0–40.0)

## 2021-08-10 LAB — HEMOGLOBIN A1C: Hgb A1c MFr Bld: 6 % (ref 4.6–6.5)

## 2021-08-10 LAB — VITAMIN B12: Vitamin B-12: 288 pg/mL (ref 211–911)

## 2021-08-17 ENCOUNTER — Encounter: Payer: Self-pay | Admitting: Nurse Practitioner

## 2021-08-25 ENCOUNTER — Encounter: Payer: Managed Care, Other (non HMO) | Admitting: Radiology

## 2021-08-29 ENCOUNTER — Encounter: Payer: Managed Care, Other (non HMO) | Admitting: Radiology

## 2021-08-29 DIAGNOSIS — Z0289 Encounter for other administrative examinations: Secondary | ICD-10-CM

## 2021-09-11 ENCOUNTER — Encounter: Payer: Managed Care, Other (non HMO) | Admitting: Radiology

## 2021-09-13 ENCOUNTER — Ambulatory Visit (INDEPENDENT_AMBULATORY_CARE_PROVIDER_SITE_OTHER): Payer: Managed Care, Other (non HMO) | Admitting: Nurse Practitioner

## 2021-09-13 ENCOUNTER — Encounter: Payer: Self-pay | Admitting: Nurse Practitioner

## 2021-09-13 VITALS — BP 158/85 | HR 98 | Temp 97.1°F | Wt 292.6 lb

## 2021-09-13 DIAGNOSIS — M545 Low back pain, unspecified: Secondary | ICD-10-CM | POA: Diagnosis not present

## 2021-09-13 DIAGNOSIS — G5711 Meralgia paresthetica, right lower limb: Secondary | ICD-10-CM | POA: Diagnosis not present

## 2021-09-13 MED ORDER — NAPROXEN 500 MG PO TABS: 500.0000 mg | ORAL_TABLET | Freq: Two times a day (BID) | ORAL | 0 refills | Status: AC

## 2021-09-13 MED ORDER — PREGABALIN 75 MG PO CAPS: 75.0000 mg | ORAL_CAPSULE | Freq: Two times a day (BID) | ORAL | 0 refills | Status: AC

## 2021-09-13 NOTE — Progress Notes (Signed)
Established Patient Office Visit  Subjective   Patient ID: Elaine Gonzales, female    DOB: 1995/07/19  Age: 26 y.o. MRN: [id number]  Chief Complaint  Patient presents with   Follow-up    4 wk f/u. Pt c/o persistent RT leg pain w/ burning sensation if standing too long. Pt states gabapentin did not help and made her extremely sleepy     HPI  Elaine Gonzales is here today to follow up on parasthetica meralgia   She states that she is still having ongoing right outer thigh numbness that is constant.  Sometimes it will start to burn.  She tried the gabapentin which did not help her symptoms and made her very sleepy.  She stopped taking this medication.  She has also started having low midline back pain that does not radiate.  She rates it as an 8/10 today.  She states that walking makes the pain worse along with laying certain ways.  She takes ibuprofen which helps for short period of time.  She denies fevers, incontinence.   Past Medical History:  Diagnosis Date   Asthma    PCOS (polycystic ovarian syndrome)    Past Surgical History:  Procedure Laterality Date   TYMPANOSTOMY TUBE PLACEMENT      ROS See pertinent positives and negatives per HPI.    Objective:     BP (!) 158/85   Pulse 98   Temp (!) 97.1 F (36.2 C) (Temporal)   Wt 292 lb 9.6 oz (132.7 kg)   SpO2 100%   BMI 52.66 kg/m    Physical Exam Vitals and nursing note reviewed.  Constitutional:      General: She is not in acute distress.    Appearance: Normal appearance.  HENT:     Head: Normocephalic.  Eyes:     Conjunctiva/sclera: Conjunctivae normal.  Pulmonary:     Effort: Pulmonary effort is normal.  Musculoskeletal:        General: No swelling or tenderness. Normal range of motion.     Cervical back: Normal range of motion.  Skin:    General: Skin is warm.  Neurological:     General: No focal deficit present.     Mental Status: She is alert and oriented to person, place, and  time.  Psychiatric:        Mood and Affect: Mood normal.        Behavior: Behavior normal.        Thought Content: Thought content normal.        Judgment: Judgment normal.      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  She states that the gabapentin did not help her at all, just made her sleepy so she stopped taking it.  Discussed weight loss.  We will try Lyrica 75 mg daily to see if she tolerates this better and if it helps.  PDMP reviewed.  We will also place referral to orthopedics.      Relevant Medications   pregabalin (LYRICA) 75 MG capsule   Other Relevant Orders   Ambulatory referral to Orthopedic Surgery     Other   Acute midline low back pain without sciatica    Acute midline low back pain.  No red flags on exam.  Stretches printed and given to patient to do daily.  We will  start her on naproxen 500 mg twice a day.  Follow-up in 4 weeks.      Relevant Medications   naproxen (NAPROSYN) 500 MG tablet    Return in about 3 months (around 12/14/2021) for CPE.    Elaine Scull, NP

## 2021-09-13 NOTE — Patient Instructions (Addendum)
It was great to see you!  Stop the gabapentin. Start lyrica 1 capsule daily. You can take this in the morning at night with or without food.   Start naproxen twice a day with food for your back. I am also attaching stretches for your back that you can do daily.   I have placed a referral to orthopedics.   Let's follow-up in 3 months, sooner if you have concerns.  If a referral was placed today, you will be contacted for an appointment. Please note that routine referrals can sometimes take up to 3-4 weeks to process. Please call our office if you haven't heard anything after this time frame.  Take care,  Rodman Pickle, NP

## 2021-09-13 NOTE — Assessment & Plan Note (Signed)
Acute midline low back pain.  No red flags on exam.  Stretches printed and given to patient to do daily.  We will start her on naproxen 500 mg twice a day.  Follow-up in 4 weeks.

## 2021-09-13 NOTE — Assessment & Plan Note (Signed)
Chronic, ongoing.  She states that the gabapentin did not help her at all, just made her sleepy so she stopped taking it.  Discussed weight loss.  We will try Lyrica 75 mg daily to see if she tolerates this better and if it helps.  PDMP reviewed.  We will also place referral to orthopedics.

## 2021-09-18 ENCOUNTER — Ambulatory Visit: Payer: Managed Care, Other (non HMO) | Admitting: Physician Assistant

## 2021-09-22 ENCOUNTER — Encounter: Payer: Managed Care, Other (non HMO) | Admitting: Radiology

## 2021-10-06 ENCOUNTER — Encounter: Payer: Managed Care, Other (non HMO) | Admitting: Radiology

## 2021-12-14 ENCOUNTER — Telehealth: Payer: Self-pay | Admitting: Nurse Practitioner

## 2021-12-14 ENCOUNTER — Encounter: Payer: Managed Care, Other (non HMO) | Admitting: Nurse Practitioner

## 2021-12-14 NOTE — Telephone Encounter (Signed)
Pt was a no show for a cpe with Lauren on 12/14/21,I sent a letter.

## 2021-12-15 NOTE — Telephone Encounter (Signed)
1st no show for cpe, fee waived, letter sent

## 2021-12-15 NOTE — Telephone Encounter (Signed)
noted 

## 2022-02-23 ENCOUNTER — Encounter: Payer: Self-pay | Admitting: Obstetrics and Gynecology

## 2022-05-08 ENCOUNTER — Encounter: Payer: Self-pay | Admitting: Family Medicine

## 2022-05-08 ENCOUNTER — Telehealth: Payer: BC Managed Care – PPO | Admitting: Family Medicine

## 2022-05-08 DIAGNOSIS — J029 Acute pharyngitis, unspecified: Secondary | ICD-10-CM | POA: Diagnosis not present

## 2022-05-08 MED ORDER — AZITHROMYCIN 250 MG PO TABS: ORAL_TABLET | ORAL | 0 refills | Status: AC

## 2022-05-08 MED ORDER — IBUPROFEN 600 MG PO TABS: 600.0000 mg | ORAL_TABLET | Freq: Three times a day (TID) | ORAL | 0 refills | Status: AC | PRN

## 2022-05-08 MED ORDER — LIDOCAINE VISCOUS HCL 2 % MT SOLN: 15.0000 mL | OROMUCOSAL | 0 refills | Status: AC | PRN

## 2022-05-08 MED ORDER — PSEUDOEPH-BROMPHEN-DM 30-2-10 MG/5ML PO SYRP: 5.0000 mL | ORAL_SOLUTION | Freq: Four times a day (QID) | ORAL | 0 refills | Status: AC | PRN

## 2022-05-08 NOTE — Patient Instructions (Addendum)
Elaine Gonzales, thank you for joining Perlie Mayo, NP for today's virtual visit.  While this provider is not your primary care provider (PCP), if your PCP is located in our provider database this encounter information will be shared with them immediately following your visit.   [facility] account gives you access to today's visit and all your visits, tests, and labs performed at [facility] " click here if you don't have a [facility] account or go to [email or web address]  Consent: (Patient) Elaine Gonzales provided verbal consent for this virtual visit at the beginning of the encounter.  Current Medications:  Current Outpatient Medications:    brompheniramine-pseudoephedrine-DM 30-2-10 MG/5ML syrup, Take 5 mLs by mouth 4 (four) times daily as needed., Disp: 120 mL, Rfl: 0   ibuprofen (ADVIL) 600 MG tablet, Take 1 tablet (600 mg total) by mouth every 8 (eight) hours as needed., Disp: 30 tablet, Rfl: 0   lidocaine (XYLOCAINE) 2 % solution, Use as directed 15 mLs in the mouth or throat as needed for mouth pain., Disp: 100 mL, Rfl: 0   albuterol (VENTOLIN HFA) 108 (90 Base) MCG/ACT inhaler, Inhale 2 puffs into the lungs every 6 (six) hours as needed for wheezing or shortness of breath., Disp: 8 g, Rfl: 3   naproxen (NAPROSYN) 500 MG tablet, Take 1 tablet (500 mg total) by mouth 2 (two) times daily with a meal., Disp: 30 tablet, Rfl: 0   pregabalin (LYRICA) 75 MG capsule, Take 1 capsule (75 mg total) by mouth 2 (two) times daily., Disp: 30 capsule, Rfl: 0   Medications ordered in this encounter:  Meds ordered this encounter  Medications   lidocaine (XYLOCAINE) 2 % solution    Sig: Use as directed 15 mLs in the mouth or throat as needed for mouth pain.    Dispense:  100 mL    Refill:  0    Order Specific Question:   Supervising Provider    Answer:   Chase Picket [id number]   ibuprofen (ADVIL) 600 MG tablet    Sig: Take 1 tablet (600 mg  total) by mouth every 8 (eight) hours as needed.    Dispense:  30 tablet    Refill:  0    Order Specific Question:   Supervising Provider    Answer:   Chase Picket [id number]   brompheniramine-pseudoephedrine-DM 30-2-10 MG/5ML syrup    Sig: Take 5 mLs by mouth 4 (four) times daily as needed.    Dispense:  120 mL    Refill:  0    Order Specific Question:   Supervising Provider    Answer:   Chase Picket [id number]     *If you need refills on other medications prior to your next appointment, please contact your pharmacy*  Follow-Up: Call back or seek an in-person evaluation if the symptoms worsen or if the condition fails to improve as anticipated.  [facility] [phone]  Other Instructions  Pharyngitis  Pharyngitis is a sore throat (pharynx). This is when there is redness, pain, and swelling in your throat. Most of the time, this condition gets better on its own. In some cases, you may need medicine. What are the causes? An infection from a virus. An infection from bacteria. Allergies. What increases the risk? Being 72-34 years old. Being in crowded environments. These include: Daycares. Schools. Dormitories. Living in a place with cold temperatures outside. Having a weakened disease-fighting (immune) system. What are the signs or  symptoms? Symptoms may vary depending on the cause. Common symptoms include: Sore throat. Tiredness (fatigue). Low-grade fever. Stuffy nose. Cough. Headache. Other symptoms may include: Glands in the neck (lymph nodes) that are swollen. Skin rashes. Film on the throat or tonsils. This can be caused by an infection from bacteria. Vomiting. Red, itchy eyes. Loss of appetite. Joint pain and muscle aches. Tonsils that are temporarily bigger than usual (enlarged). How is this treated? Many times, treatment is not needed. This condition usually gets better in 3-4 days without treatment. If the infection is caused by a  bacteria, you may be need to take antibiotics. Follow these instructions at home: Medicines Take over-the-counter and prescription medicines only as told by your doctor. If you were prescribed an antibiotic medicine, take it as told by your doctor. Do not stop taking the antibiotic even if you start to feel better. Use throat lozenges or sprays to soothe your throat as told by your doctor. Children can get pharyngitis. Do not give your child aspirin. Managing pain To help with pain, try: Sipping warm liquids, such as: Broth. Herbal tea. Warm water. Eating or drinking cold or frozen liquids, such as frozen ice pops. Rinsing your mouth (gargle) with a salt water mixture 3-4 times a day or as needed. To make salt water, dissolve -1 tsp (3-6 g) of salt in 1 cup (237 mL) of warm water. Do not swallow this mixture. Sucking on hard candy or throat lozenges. Putting a cool-mist humidifier in your bedroom at night to moisten the air. Sitting in the bathroom with the door closed for 5-10 minutes while you run hot water in the shower.  General instructions  Do not smoke or use any products that contain nicotine or tobacco. If you need help quitting, ask your doctor. Rest as told by your doctor. Drink enough fluid to keep your pee (urine) pale yellow. How is this prevented? Wash your hands often for at least 20 seconds with soap and water. If soap and water are not available, use hand sanitizer. Do not touch your eyes, nose, or mouth with unwashed hands. Wash hands after touching these areas. Do not share cups or eating utensils. Avoid close contact with people who are sick. Contact a doctor if: You have large, tender lumps in your neck. You have a rash. You cough up green, yellow-brown, or bloody spit. Get help right away if: You have a stiff neck. You drool or cannot swallow liquids. You cannot drink or take medicines without vomiting. You have very bad pain that does not go away with  medicine. You have problems breathing, and it is not from a stuffy nose. You have new pain and swelling in your knees, ankles, wrists, or elbows. These symptoms may be an emergency. Get help right away. Call your local emergency services (911 in the [place] Do not wait to see if the symptoms will go away. Do not drive yourself to the hospital. Summary Pharyngitis is a sore throat (pharynx). This is when there is redness, pain, and swelling in your throat. Most of the time, pharyngitis gets better on its own. Sometimes, you may need medicine. If you were prescribed an antibiotic medicine, take it as told by your doctor. Do not stop taking the antibiotic even if you start to feel better. This information is not intended to replace advice given to you by your health care provider. Make sure you discuss any questions you have with your health care provider. Document Revised: 06/01/2020 Document Reviewed:  06/01/2020 Elsevier Patient Education  [place].     If you have been instructed to have an in-person evaluation today at a local Urgent Care facility, please use the link below. It will take you to a list of all of our available [facility], including address, phone number and hours of operation. Please do not delay care.  [facility]  If you or a family member do not have a primary care provider, use the link below to schedule a visit and establish care. When you choose a [facility] primary care physician or advanced practice provider, you gain a long-term partner in health. Find a Primary Care Provider  Learn more about [facility]'s in-office and virtual care options: [facility]

## 2022-05-08 NOTE — Progress Notes (Signed)
Virtual Visit Consent   Elaine Gonzales, you are scheduled for a virtual visit with a [facility] provider today. Just as with appointments in the office, your consent must be obtained to participate. Your consent will be active for this visit and any virtual visit you may have with one of our providers in the next 365 days. If you have a MyChart account, a copy of this consent can be sent to you electronically.  As this is a virtual visit, video technology does not allow for your provider to perform a traditional examination. This may limit your provider's ability to fully assess your condition. If your provider identifies any concerns that need to be evaluated in person or the need to arrange testing (such as labs, EKG, etc.), we will make arrangements to do so. Although advances in technology are sophisticated, we cannot ensure that it will always work on either your end or our end. If the connection with a video visit is poor, the visit may have to be switched to a telephone visit. With either a video or telephone visit, we are not always able to ensure that we have a secure connection.  By engaging in this virtual visit, you consent to the provision of healthcare and authorize for your insurance to be billed (if applicable) for the services provided during this visit. Depending on your insurance coverage, you may receive a charge related to this service.  I need to obtain your verbal consent now. Are you willing to proceed with your visit today? Elaine Gonzales has provided verbal consent on 05/08/2022 for a virtual visit (video or telephone). Perlie Mayo, NP  Date: 05/08/2022 3:41 PM  Virtual Visit via Video Note   I, Perlie Mayo, connected with  Elaine Gonzales  ([id number], 04-14-1995) on 05/08/22 at  3:45 PM EST by a video-enabled telemedicine application and verified that I am speaking with the correct person using two identifiers.  Location: Patient: Virtual Visit  Location Patient: Home Provider: Virtual Visit Location Provider: Home Office   I discussed the limitations of evaluation and management by telemedicine and the availability of in person appointments. The patient expressed understanding and agreed to proceed.    History of Present Illness: Elaine Gonzales is a 27 y.o. who identifies as a female who was assigned female at birth, and is being seen today for   Onset was yesterday with sore throat Associated symptoms are swelling and redness in throat, ear pain, trouble swallowing. Feverish feeling- did not check Modifying factors are ibuprofen -little to no relief Denies chest pain, shortness of breath, fevers, chills Exposure to sick contacts- unknown COVID test: not tested Vaccines: flu shot, no booster covid  Problems:  Patient Active Problem List   Diagnosis Date Noted   Acute midline low back pain without sciatica 09/13/2021   Mild intermittent asthma without complication 123456   PCOS (polycystic ovarian syndrome) 08/09/2021   Prediabetes 08/09/2021   Meralgia paresthetica of right side 08/09/2021    Allergies:  Allergies  Allergen Reactions   Penicillins Anaphylaxis   Amoxicillin Swelling   Medications:  Current Outpatient Medications:    albuterol (VENTOLIN HFA) 108 (90 Base) MCG/ACT inhaler, Inhale 2 puffs into the lungs every 6 (six) hours as needed for wheezing or shortness of breath., Disp: 8 g, Rfl: 3   naproxen (NAPROSYN) 500 MG tablet, Take 1 tablet (500 mg total) by mouth 2 (two) times daily with a meal., Disp: 30 tablet, Rfl: 0  pregabalin (LYRICA) 75 MG capsule, Take 1 capsule (75 mg total) by mouth 2 (two) times daily., Disp: 30 capsule, Rfl: 0  Observations/Objective: Patient is well-developed, well-nourished in no acute distress.  Resting comfortably  at home.  Head is normocephalic, atraumatic.  No labored breathing.  Speech is clear and coherent with logical content.  Patient is alert and  oriented at baseline.  Thick voice- but clear  Assessment and Plan:  1. Pharyngitis, unspecified etiology  - lidocaine (XYLOCAINE) 2 % solution; Use as directed 15 mLs in the mouth or throat as needed for mouth pain.  Dispense: 100 mL; Refill: 0 - ibuprofen (ADVIL) 600 MG tablet; Take 1 tablet (600 mg total) by mouth every 8 (eight) hours as needed.  Dispense: 30 tablet; Refill: 0 - brompheniramine-pseudoephedrine-DM 30-2-10 MG/5ML syrup; Take 5 mLs by mouth 4 (four) times daily as needed.  Dispense: 120 mL; Refill: 0 - azithromycin (ZITHROMAX) 250 MG tablet; Take 2 tablets on day 1, then 1 tablet daily on days 2 through 5  Dispense: 6 tablet; Refill: 0  -meds as above- pharyngitis vs tonsillitis  -strict in person precautions -salt water gargles -ibuprofen as need   Reviewed side effects, risks and benefits of medication.    Patient acknowledged agreement and understanding of the plan.   Past Medical, Surgical, Social History, Allergies, and Medications have been Reviewed.      Follow Up Instructions: I discussed the assessment and treatment plan with the patient. The patient was provided an opportunity to ask questions and all were answered. The patient agreed with the plan and demonstrated an understanding of the instructions.  A copy of instructions were sent to the patient via MyChart unless otherwise noted below.    The patient was advised to call back or seek an in-person evaluation if the symptoms worsen or if the condition fails to improve as anticipated.  Time:  I spent 10 minutes with the patient via telehealth technology discussing the above problems/concerns.    Perlie Mayo, NP

## 2022-06-04 ENCOUNTER — Telehealth: Payer: Medicaid Other | Admitting: Physician Assistant

## 2022-06-04 ENCOUNTER — Telehealth: Payer: Medicaid Other

## 2022-06-04 DIAGNOSIS — R051 Acute cough: Secondary | ICD-10-CM | POA: Diagnosis not present

## 2022-06-04 MED ORDER — FLUTICASONE PROPIONATE 50 MCG/ACT NA SUSP: 2.0000 | Freq: Every day | NASAL | 6 refills | Status: AC

## 2022-06-04 MED ORDER — BENZONATATE 100 MG PO CAPS: 100.0000 mg | ORAL_CAPSULE | Freq: Three times a day (TID) | ORAL | 0 refills | Status: AC | PRN

## 2022-06-04 NOTE — Patient Instructions (Signed)
Elaine Gonzales, thank you for joining Lenise Arena Ward, PA-C for today's virtual visit.  While this provider is not your primary care provider (PCP), if your PCP is located in our provider database this encounter information will be shared with them immediately following your visit.   [facility] account gives you access to today's visit and all your visits, tests, and labs performed at [facility] " click here if you don't have a [facility] account or go to [email or web address]  Consent: (Patient) Elaine Gonzales provided verbal consent for this virtual visit at the beginning of the encounter.  Current Medications:  Current Outpatient Medications:    benzonatate (TESSALON) 100 MG capsule, Take 1 capsule (100 mg total) by mouth 3 (three) times daily as needed., Disp: 20 capsule, Rfl: 0   fluticasone (FLONASE) 50 MCG/ACT nasal spray, Place 2 sprays into both nostrils daily., Disp: 16 g, Rfl: 6   albuterol (VENTOLIN HFA) 108 (90 Base) MCG/ACT inhaler, Inhale 2 puffs into the lungs every 6 (six) hours as needed for wheezing or shortness of breath., Disp: 8 g, Rfl: 3   brompheniramine-pseudoephedrine-DM 30-2-10 MG/5ML syrup, Take 5 mLs by mouth 4 (four) times daily as needed., Disp: 120 mL, Rfl: 0   ibuprofen (ADVIL) 600 MG tablet, Take 1 tablet (600 mg total) by mouth every 8 (eight) hours as needed., Disp: 30 tablet, Rfl: 0   lidocaine (XYLOCAINE) 2 % solution, Use as directed 15 mLs in the mouth or throat as needed for mouth pain., Disp: 100 mL, Rfl: 0   naproxen (NAPROSYN) 500 MG tablet, Take 1 tablet (500 mg total) by mouth 2 (two) times daily with a meal., Disp: 30 tablet, Rfl: 0   pregabalin (LYRICA) 75 MG capsule, Take 1 capsule (75 mg total) by mouth 2 (two) times daily., Disp: 30 capsule, Rfl: 0   Medications ordered in this encounter:  Meds ordered this encounter  Medications   benzonatate (TESSALON) 100 MG capsule    Sig: Take 1  capsule (100 mg total) by mouth 3 (three) times daily as needed.    Dispense:  20 capsule    Refill:  0    Order Specific Question:   Supervising Provider    Answer:   Chase Picket [id number]   fluticasone (FLONASE) 50 MCG/ACT nasal spray    Sig: Place 2 sprays into both nostrils daily.    Dispense:  16 g    Refill:  6    Order Specific Question:   Supervising Provider    Answer:   Chase Picket [id number]     *If you need refills on other medications prior to your next appointment, please contact your pharmacy*  Follow-Up: Call back or seek an in-person evaluation if the symptoms worsen or if the condition fails to improve as anticipated.  [facility] [phone]  Other Instructions Recommend daily allergy medication like Zyrtec or Claritin.  Use Flonase once daily.  Use Tessalon as needed for cough.  Use inhaler as needed for shortness of breath or wheezing.   If you have been instructed to have an in-person evaluation today at a local Urgent Care facility, please use the link below. It will take you to a list of all of our available [facility], including address, phone number and hours of operation. Please do not delay care.  [facility]  If you or a family member do not have a primary care provider, use  the link below to schedule a visit and establish care. When you choose a [facility] primary care physician or advanced practice provider, you gain a long-term partner in health. Find a Primary Care Provider  Learn more about [facility]'s in-office and virtual care options: [facility]

## 2022-06-04 NOTE — Progress Notes (Signed)
Virtual Visit Consent   Clare Charon, you are scheduled for a virtual visit with a [facility] provider today. Just as with appointments in the office, your consent must be obtained to participate. Your consent will be active for this visit and any virtual visit you may have with one of our providers in the next 365 days. If you have a MyChart account, a copy of this consent can be sent to you electronically.  As this is a virtual visit, video technology does not allow for your provider to perform a traditional examination. This may limit your provider's ability to fully assess your condition. If your provider identifies any concerns that need to be evaluated in person or the need to arrange testing (such as labs, EKG, etc.), we will make arrangements to do so. Although advances in technology are sophisticated, we cannot ensure that it will always work on either your end or our end. If the connection with a video visit is poor, the visit may have to be switched to a telephone visit. With either a video or telephone visit, we are not always able to ensure that we have a secure connection.  By engaging in this virtual visit, you consent to the provision of healthcare and authorize for your insurance to be billed (if applicable) for the services provided during this visit. Depending on your insurance coverage, you may receive a charge related to this service.  I need to obtain your verbal consent now. Are you willing to proceed with your visit today? SKYLYN BILINSKI has provided verbal consent on 06/04/2022 for a virtual visit (video or telephone). Lenise Arena Ward, PA-C  Date: 06/04/2022 5:18 PM  Virtual Visit via Video Note   I, Lenise Arena Ward, connected with  Elaine Gonzales  ([id number], [birth year]) on 06/04/22 at  5:15 PM EDT by a video-enabled telemedicine application and verified that I am speaking with the correct person using two identifiers.  Location: Patient:  home Provider: Virtual Visit Location Provider: Home Office   I discussed the limitations of evaluation and management by telemedicine and the availability of in person appointments. The patient expressed understanding and agreed to proceed.    History of Present Illness: Elaine Gonzales is a 27 y.o. who identifies as a female who was assigned female at birth, and is being seen today for cough that started about one week.  Denies congestion or postnasal.  She has a h/o asthma, reports she has not had to use her inhaler.  She has tried a cough syrup.  She denies fever, chills, wheezing, or shortness breath.  HPI: HPI  Problems:  Patient Active Problem List   Diagnosis Date Noted   Acute midline low back pain without sciatica 09/13/2021   Mild intermittent asthma without complication 123456   PCOS (polycystic ovarian syndrome) 08/09/2021   Prediabetes 08/09/2021   Meralgia paresthetica of right side 08/09/2021    Allergies:  Allergies  Allergen Reactions   Penicillins Anaphylaxis   Amoxicillin Swelling   Medications:  Current Outpatient Medications:    benzonatate (TESSALON) 100 MG capsule, Take 1 capsule (100 mg total) by mouth 3 (three) times daily as needed., Disp: 20 capsule, Rfl: 0   fluticasone (FLONASE) 50 MCG/ACT nasal spray, Place 2 sprays into both nostrils daily., Disp: 16 g, Rfl: 6   albuterol (VENTOLIN HFA) 108 (90 Base) MCG/ACT inhaler, Inhale 2 puffs into the lungs every 6 (six) hours as needed for wheezing or shortness of breath., Disp: 8 g,  Rfl: 3   brompheniramine-pseudoephedrine-DM 30-2-10 MG/5ML syrup, Take 5 mLs by mouth 4 (four) times daily as needed., Disp: 120 mL, Rfl: 0   ibuprofen (ADVIL) 600 MG tablet, Take 1 tablet (600 mg total) by mouth every 8 (eight) hours as needed., Disp: 30 tablet, Rfl: 0   lidocaine (XYLOCAINE) 2 % solution, Use as directed 15 mLs in the mouth or throat as needed for mouth pain., Disp: 100 mL, Rfl: 0   naproxen (NAPROSYN)  500 MG tablet, Take 1 tablet (500 mg total) by mouth 2 (two) times daily with a meal., Disp: 30 tablet, Rfl: 0   pregabalin (LYRICA) 75 MG capsule, Take 1 capsule (75 mg total) by mouth 2 (two) times daily., Disp: 30 capsule, Rfl: 0  Observations/Objective: Patient is well-developed, well-nourished in no acute distress.  Resting comfortably at home.  Head is normocephalic, atraumatic.  No labored breathing.  Speech is clear and coherent with logical content.  Patient is alert and oriented at baseline.    Assessment and Plan: 1. Acute cough - benzonatate (TESSALON) 100 MG capsule; Take 1 capsule (100 mg total) by mouth 3 (three) times daily as needed.  Dispense: 20 capsule; Refill: 0 - fluticasone (FLONASE) 50 MCG/ACT nasal spray; Place 2 sprays into both nostrils daily.  Dispense: 16 g; Refill: 6  Pt with no fever, chills, congestion, wheezing, shortness of breath.  Cough may be allergies vs asthma.  Advised daily allergy meds and inhaler as needed.  Speaking in complete sentences, in no acute distress.  Precautions for in person evaluation discussed.   Follow Up Instructions: I discussed the assessment and treatment plan with the patient. The patient was provided an opportunity to ask questions and all were answered. The patient agreed with the plan and demonstrated an understanding of the instructions.  A copy of instructions were sent to the patient via MyChart unless otherwise noted below.     The patient was advised to call back or seek an in-person evaluation if the symptoms worsen or if the condition fails to improve as anticipated.  Time:  I spent 6 minutes with the patient via telehealth technology discussing the above problems/concerns.    Lenise Arena Ward, PA-C

## 2022-06-06 ENCOUNTER — Encounter: Payer: Self-pay | Admitting: Obstetrics and Gynecology

## 2022-06-06 ENCOUNTER — Ambulatory Visit (INDEPENDENT_AMBULATORY_CARE_PROVIDER_SITE_OTHER): Payer: Medicaid Other | Admitting: Obstetrics and Gynecology

## 2022-06-06 ENCOUNTER — Encounter: Payer: Self-pay | Admitting: Emergency Medicine

## 2022-06-06 ENCOUNTER — Other Ambulatory Visit (HOSPITAL_COMMUNITY)
Admission: RE | Admit: 2022-06-06 | Discharge: 2022-06-06 | Disposition: A | Discharge status: Home / Self Care | Payer: Medicaid Other | Source: Ambulatory Visit | Attending: Obstetrics and Gynecology | Admitting: Obstetrics and Gynecology

## 2022-06-06 VITALS — BP 142/89 | HR 83 | Ht 63.0 in | Wt 296.0 lb

## 2022-06-06 DIAGNOSIS — Z01419 Encounter for gynecological examination (general) (routine) without abnormal findings: Secondary | ICD-10-CM

## 2022-06-06 DIAGNOSIS — E282 Polycystic ovarian syndrome: Secondary | ICD-10-CM | POA: Diagnosis not present

## 2022-06-06 DIAGNOSIS — Z1339 Encounter for screening examination for other mental health and behavioral disorders: Secondary | ICD-10-CM | POA: Diagnosis not present

## 2022-06-06 DIAGNOSIS — Z6841 Body Mass Index (BMI) 40.0 and over, adult: Secondary | ICD-10-CM | POA: Diagnosis not present

## 2022-06-06 MED ORDER — MEDROXYPROGESTERONE ACETATE 10 MG PO TABS: 10.0000 mg | ORAL_TABLET | Freq: Every day | ORAL | 2 refills | Status: AC

## 2022-06-06 MED ORDER — LETROZOLE 2.5 MG PO TABS: 2.5000 mg | ORAL_TABLET | Freq: Every day | ORAL | 2 refills | Status: AC

## 2022-06-06 MED ORDER — METFORMIN HCL 500 MG PO TABS: ORAL_TABLET | ORAL | 5 refills | Status: AC

## 2022-06-06 NOTE — Progress Notes (Signed)
27 y.o New GYN presents for AEX/PAP/STD screening.  Pt wants to know why she has not conceived, they have been trying for 1 yr.

## 2022-06-06 NOTE — Progress Notes (Signed)
Subjective:     Elaine Gonzales is a 27 y.o. female P0 with LMP 04/28/22 and BMI 52 who is here for a comprehensive physical exam. The patient reports irregular menses. She reports amenorrhea for 2 years and oligomenorrhea for the past 6 months. She was diagnosed with PCOS and prescribed provera and metformin 2 years ago. She is actively seeking a pregnancy. She denies pelvic pain or abnormal discharge. She reports that she and her partner are otherwise healthy and non smokers. She is without any other complaints.   Past Medical History:  Diagnosis Date   Asthma    PCOS (polycystic ovarian syndrome)    Past Surgical History:  Procedure Laterality Date   TYMPANOSTOMY TUBE PLACEMENT     Family History  Problem Relation Age of Onset   Diabetes Mother    Cancer Mother        unsure which kind    Social History   Socioeconomic History   Marital status: Single    Spouse name: Not on file   Number of children: Not on file   Years of education: Not on file   Highest education level: Not on file  Occupational History   Not on file  Tobacco Use   Smoking status: Never   Smokeless tobacco: Never  Vaping Use   Vaping Use: Never used  Substance and Sexual Activity   Alcohol use: Yes    Comment: rarely   Drug use: Never   Sexual activity: Yes    Birth control/protection: None  Other Topics Concern   Not on file  Social History Narrative   Not on file   Social Determinants of Health   Financial Resource Strain: Not on file  Food Insecurity: Not on file  Transportation Needs: Not on file  Physical Activity: Not on file  Stress: Not on file  Social Connections: Not on file  Intimate Partner Violence: Not on file   Health Maintenance  Topic Date Due   HPV VACCINES (3 - 2-dose series) 09/13/2008   INFLUENZA VACCINE  Never done   PAP-Cervical Cytology Screening  09/24/2023   PAP SMEAR-Modifier  09/24/2023   DTaP/Tdap/Td (2 - Td or Tdap) 08/10/2031   Hepatitis C  Screening  Completed   HIV Screening  Completed   COVID-19 Vaccine  Discontinued       Review of Systems Pertinent items noted in HPI and remainder of comprehensive ROS otherwise negative.   Objective:  Blood pressure (!) 143/94, pulse 94, height 5\' 3"  (1.6 m), weight 296 lb (134.3 kg), last menstrual period 04/28/2022.   GENERAL: Well-developed, well-nourished female in no acute distress.  HEENT: Normocephalic, atraumatic. Sclerae anicteric.  NECK: Supple. Normal thyroid.  LUNGS: Clear to auscultation bilaterally.  HEART: Regular rate and rhythm. BREASTS: Symmetric in size. No palpable masses or lymphadenopathy, skin changes, or nipple drainage. ABDOMEN: Soft, nontender, nondistended. No organomegaly. PELVIC: Normal external female genitalia. Vagina is pink and rugated.  Normal discharge. Normal appearing cervix. Uterus is normal in size. No adnexal mass or tenderness. Chaperone present during the pelvic exam EXTREMITIES: No cyanosis, clubbing, or edema, 2+ distal pulses.     Assessment:    Healthy female exam.      Plan:    Pap smear collected STI screening per patient request Long discussion regarding diagnosis of PCOS and its implication on overall health and menstrual cycle. Discussed benefits of weight loss and patient was referred to a nutritionist Patient will be contacted with abnormal results Rx metformin provided  Patient advised to focus on weight loss management and improving overall health prior to conception (HTN and pre-diabetes 2 years ago) See After Visit Summary for Counseling Recommendations

## 2022-06-07 LAB — CERVICOVAGINAL ANCILLARY ONLY
Chlamydia: NEGATIVE
Comment: NEGATIVE
Comment: NORMAL
Neisseria Gonorrhea: NEGATIVE

## 2022-06-07 LAB — HIV ANTIBODY (ROUTINE TESTING W REFLEX): HIV Screen 4th Generation wRfx: NONREACTIVE

## 2022-06-07 LAB — HEPATITIS B SURFACE ANTIGEN: Hepatitis B Surface Ag: NEGATIVE

## 2022-06-07 LAB — RPR: RPR Ser Ql: NONREACTIVE

## 2022-06-07 LAB — CYTOLOGY - PAP: Diagnosis: NEGATIVE

## 2022-06-07 LAB — HEPATITIS C ANTIBODY: Hep C Virus Ab: NONREACTIVE

## 2022-07-16 ENCOUNTER — Encounter: Payer: Self-pay | Admitting: Registered"

## 2022-07-16 ENCOUNTER — Encounter: Payer: Medicaid Other | Attending: Obstetrics and Gynecology | Admitting: Registered"

## 2022-07-16 DIAGNOSIS — E282 Polycystic ovarian syndrome: Secondary | ICD-10-CM

## 2022-07-16 NOTE — Patient Instructions (Signed)
Increase omega 3 fish 3 times per week Salmon, tuna, sardines (with bones). Tilapia ok to mix it up, but doesn't have as much omega 3 Plant-based sources Walnuts, chia seeds, flaxseeds  Avoid skipping meals, consider having fruit and/or vegetables for snacks instead of chips or crackers.  No soda or juice  Sample meals: Salmon, asparagus, pasta Baked or sauteed chicken breast, green beans, brown rice other whole grain (less than 1 cup) Meatballs, pasta (whole grain), salad  Breakfast ideas: (Consider having dinner before 7 pm) (Lactaide if lactose intolerant or A2 milk of it is the protein causing digestive issue) Eggs, whole toast Cheerios and [place] yogurt with less than 10g sugar Left overs Peanut butter toast High protein protein shake

## 2022-07-16 NOTE — Progress Notes (Signed)
Medical Nutrition Therapy  Appointment Start time:  [phone]  Appointment End time:  34  Primary concerns today: improve diet to address HTN, PCOS symptoms and preconception health  Referral diagnosis: PCOS Preferred learning style:  no preference indicated Learning readiness: ready, change in progress  NUTRITION ASSESSMENT  Anthropometrics  Wt Readings from Last 3 Encounters:  06/06/22 296 lb (134.3 kg)  09/13/21 292 lb 9.6 oz (132.7 kg)  08/09/21 294 lb (133.4 kg)   Clinical Medical Hx: PCOS, HTN, low vitamin D Medications: metformin 500 mg with lunch Labs: LDL 134 Lab Results  Component Value Date   HGBA1C 6.0 08/09/2021   Notable Signs/Symptoms: weight gain, irregular cycles, reduced energy  Lifestyle & Dietary Hx Pt states she remember taking a high dose vitamin D for deficiency. PT getting some sun exposure when she walks outside on work breaks.  Pt states when she goes to sleep at 10 when feeling sleepy is able to get better sleep, but if stays up until midnight has a harder time falling asleep.  Pt reports she thinks she is lactose intolerant because any type of dairy bothers her including cheese and sour cream. Pt may have milk protein intolerance/allergy. A2 milk may be better tolerated.  Pt states states for lunch she might have cereal, sandwich or salad or ~1x/week skips lunch and will just have a snack. Pt states she usually eats chicken, rice, vegetables during the week. Pt states she doesn't eat treats on a regular basis.   Estimated daily fluid intake: 32-48 oz water, juice, soda,  Supplements: none Sleep: 6 hr wakes up 3 throughout, sometimes awake 10-15 min;  Stress / self-care: low stress / self-care includes music, games, talking to friends, family Current average weekly physical activity: ADL  24-Hr Dietary Recall (Sunday) First Meal: none Snack: none Second Meal: eggs, pancakes, sausage, orange juice Snack: chips Third Meal: burger and fries, ginger  ale Snack: none Beverages: alcohol 1x week, water, 12 oz soda 2-3 x/week, 2x week coffee cream & sugar  Estimated Energy Needs Calories: not assessed  NUTRITION DIAGNOSIS  NB-1.1 Food and nutrition-related knowledge deficit As related to importance of protein at breakfast.  As evidenced by dietary recall, pt states skips breakfast sometimes.  NUTRITION INTERVENTION  Nutrition education (E-1) on the following topics:  PCOS pathophysiology Insulin & glucose role in blood sugar control A1c MyPlate Omega 3  Handouts Provided Include  Nutrition Facts Labels A1c chart  Learning Style & Readiness for Change Teaching method utilized: Visual & Auditory  Demonstrated degree of understanding via: Teach Back  Barriers to learning/adherence to lifestyle change: none  Goals Established by Pt Increase omega 3 fish 3 times per week Salmon, tuna, sardines (with bones). Tilapia ok to mix it up, but doesn't have as much omega 3 Plant-based sources Walnuts, chia seeds, flaxseeds  Avoid skipping meals, consider having fruit and/or vegetables for snacks instead of chips or crackers.  No soda or juice  Sample meals: Salmon, asparagus, pasta Baked or sauteed chicken breast, green beans, brown rice other whole grain (less than 1 cup) Meatballs, pasta (whole grain), salad  Breakfast ideas: (Consider having dinner before 7 pm) (Lactaide if lactose intolerant or A2 milk of it is the protein causing digestive issue) Eggs, whole toast Cheerios and [place] yogurt with less than 10g sugar Left overs Peanut butter toast High protein protein shake  MONITORING & EVALUATION Dietary intake, weekly physical activity, and sleep in 2 months.

## 2022-07-17 ENCOUNTER — Encounter: Payer: Self-pay | Admitting: Obstetrics and Gynecology

## 2022-07-22 ENCOUNTER — Encounter: Payer: Self-pay | Admitting: Nurse Practitioner

## 2022-08-03 ENCOUNTER — Ambulatory Visit: Payer: Medicaid Other | Admitting: Nurse Practitioner

## 2022-08-08 ENCOUNTER — Ambulatory Visit: Payer: Medicaid Other | Admitting: Nurse Practitioner

## 2022-08-16 ENCOUNTER — Ambulatory Visit: Payer: Medicaid Other | Admitting: Nurse Practitioner

## 2022-08-22 ENCOUNTER — Ambulatory Visit: Payer: Medicaid Other | Admitting: Registered"

## 2022-08-27 ENCOUNTER — Encounter: Payer: Self-pay | Admitting: Obstetrics and Gynecology

## 2022-08-29 ENCOUNTER — Ambulatory Visit: Payer: Medicaid Other | Admitting: Nurse Practitioner

## 2022-08-29 ENCOUNTER — Telehealth: Payer: Self-pay | Admitting: Nurse Practitioner

## 2022-08-29 ENCOUNTER — Ambulatory Visit (INDEPENDENT_AMBULATORY_CARE_PROVIDER_SITE_OTHER): Payer: Medicaid Other | Admitting: Plastic Surgery

## 2022-08-29 ENCOUNTER — Encounter: Payer: Self-pay | Admitting: Plastic Surgery

## 2022-08-29 ENCOUNTER — Telehealth: Payer: Medicaid Other

## 2022-08-29 VITALS — BP 139/82 | HR 92

## 2022-08-29 DIAGNOSIS — L910 Hypertrophic scar: Secondary | ICD-10-CM | POA: Diagnosis not present

## 2022-08-29 NOTE — Progress Notes (Signed)
Referring Provider Gerre Scull, NP [address] [place],  [place] [address]   CC:  Chief Complaint  Patient presents with   Advice Only      Elaine Gonzales is an 27 y.o. female.  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  The keloid has been there for several years and she says it is growing in size.  It is now so large that it pulls the ear over.  She would like to have it removed.  Allergies  Allergen Reactions   Penicillins Anaphylaxis   Amoxicillin Swelling    Outpatient Encounter Medications as of 08/29/2022  Medication Sig   albuterol (VENTOLIN HFA) 108 (90 Base) MCG/ACT inhaler Inhale 2 puffs into the lungs every 6 (six) hours as needed for wheezing or shortness of breath.   benzonatate (TESSALON) 100 MG capsule Take 1 capsule (100 mg total) by mouth 3 (three) times daily as needed.   brompheniramine-pseudoephedrine-DM 30-2-10 MG/5ML syrup Take 5 mLs by mouth 4 (four) times daily as needed.   cholecalciferol (VITAMIN D3) 25 MCG (1000 UNIT) tablet Take 1,000 Units by mouth daily.   fluticasone (FLONASE) 50 MCG/ACT nasal spray Place 2 sprays into both nostrils daily.   ibuprofen (ADVIL) 600 MG tablet Take 1 tablet (600 mg total) by mouth every 8 (eight) hours as needed.   letrozole (FEMARA) 2.5 MG tablet Take 1 tablet (2.5 mg total) by mouth daily. Take on days 3 to 7 following a spontaneous menses or progestin-induced bleed.   lidocaine (XYLOCAINE) 2 % solution Use as directed 15 mLs in the mouth or throat as needed for mouth pain.   medroxyPROGESTERone (PROVERA) 10 MG tablet Take 1 tablet (10 mg total) by mouth daily. Use for ten days   metFORMIN (GLUCOPHAGE) 500 MG tablet Take one tablet by mouth daily for one week. Then increase to one tablet twice a day for one week.  Then two tablets twice a day.   naproxen (NAPROSYN) 500 MG tablet Take 1 tablet (500 mg total) by mouth 2 (two) times daily  with a meal.   pregabalin (LYRICA) 75 MG capsule Take 1 capsule (75 mg total) by mouth 2 (two) times daily.   No facility-administered encounter medications on file as of 08/29/2022.     Past Medical History:  Diagnosis Date   Asthma    PCOS (polycystic ovarian syndrome)     Past Surgical History:  Procedure Laterality Date   NO PAST SURGERIES     TYMPANOSTOMY TUBE PLACEMENT      Family History  Problem Relation Age of Onset   Diabetes Mother    Cancer Mother        unsure which kind    Social History   Social History Narrative   Not on file     Review of Systems General: Denies fevers, chills, weight loss CV: Denies chest pain, shortness of breath, palpitations Skin: 2 cm keloid on the left helical rim  Physical Exam    08/29/2022   11:08 AM 06/06/2022    9:36 AM 06/06/2022    9:29 AM  Vitals with BMI  Height   5\' 3"   Weight   296 lbs  BMI   52.45  Systolic 139 142 604  Diastolic 82 89 94  Pulse 92 83 94    General:  No acute distress,  Alert and oriented, Non-Toxic, Normal speech and affect Skin: 2 cm keloid on  the left helical rim. Mammogram: Not applicable Assessment/Plan Keloid: Patient has a keloid which would be amenable to resection in the office.  We did discuss recurrence rates and I told her that I felt that the best chance of preventing recurrence was with low-dose radiation therapy after the resection.  She seems interested in this.  Will schedule her for resection of the keloid and will place a consult to radiation oncology for evaluation.  Photographs were obtained today with her consent.  [name] 08/29/2022, 4:39 PM

## 2022-08-29 NOTE — Telephone Encounter (Signed)
2nd no show, cannot bill fee/Medicaid plan, final warning sent via mail and mychart, text sent   12/14/2021 and 08/29/2022 no shows

## 2022-08-29 NOTE — Telephone Encounter (Signed)
Noted  

## 2022-08-29 NOTE — Telephone Encounter (Signed)
Pt NS on 6/12 no reason available letter sent

## 2022-08-30 ENCOUNTER — Telehealth: Payer: Self-pay | Admitting: Radiation Oncology

## 2022-08-30 NOTE — Telephone Encounter (Signed)
Called patient to schedule a consultation w. Dr. Moody. No answer, LVM for a return call.  

## 2022-09-03 ENCOUNTER — Encounter: Payer: Self-pay | Admitting: Obstetrics and Gynecology

## 2022-09-03 ENCOUNTER — Telehealth (INDEPENDENT_AMBULATORY_CARE_PROVIDER_SITE_OTHER): Payer: Medicaid Other | Admitting: Obstetrics and Gynecology

## 2022-09-03 DIAGNOSIS — E282 Polycystic ovarian syndrome: Secondary | ICD-10-CM

## 2022-09-03 DIAGNOSIS — L910 Hypertrophic scar: Secondary | ICD-10-CM | POA: Insufficient documentation

## 2022-09-03 DIAGNOSIS — N939 Abnormal uterine and vaginal bleeding, unspecified: Secondary | ICD-10-CM | POA: Diagnosis not present

## 2022-09-03 NOTE — Progress Notes (Signed)
    GYNECOLOGY VIRTUAL VISIT ENCOUNTER NOTE  Provider location: [facility]   Patient location: Home  I connected with [name] on 09/03/22 at  1:10 PM EDT by MyChart Video Encounter and verified that I am speaking with the correct person using two identifiers.   I discussed the limitations, risks, security and privacy concerns of performing an evaluation and management service virtually and the availability of in person appointments. I also discussed with the patient that there may be a patient responsible charge related to this service. The patient expressed understanding and agreed to proceed.   History:  Elaine Gonzales is a 27 y.o. G0P0000 female being evaluated today for AUB. Patient reports onset of a period on 08/13/22. Her periods typically last 5 days but she reports heavy flow for 10 days followed by a week of vaginal spotting. Patient denies any chest pain or shortness of breath. She denies lightheadedness. She denies any abnormal vaginal discharge, pelvic pain or other concerns. Patient reports she typically skips menses rather than bleeding excessively. She has been working with a nutritionist and is still interested in conception       Past Medical History:  Diagnosis Date   Asthma    PCOS (polycystic ovarian syndrome)    Past Surgical History:  Procedure Laterality Date   NO PAST SURGERIES     TYMPANOSTOMY TUBE PLACEMENT     The following portions of the patient's history were reviewed and updated as appropriate: allergies, current medications, past family history, past medical history, past social history, past surgical history and problem list.   Health Maintenance:  Normal pap and negative HRHPV   Review of Systems:  Pertinent items noted in HPI and remainder of comprehensive ROS otherwise negative.  Physical Exam:   General:  Alert, oriented and cooperative. Patient appears to be in no acute distress.  Mental  Status: Normal mood and affect. Normal behavior. Normal judgment and thought content.   Respiratory: Normal respiratory effort, no problems with respiration noted  Rest of physical exam deferred due to type of encounter  Labs and Imaging No results found for this or any previous visit (from the past 336 hour(s)). No results found.     Assessment and Plan:     1. Abnormal uterine bleeding (AUB)   2. PCOS (polycystic ovarian syndrome) Reassure provided Patient advised to continue tracking menses Precautions reviewed and reasons to present to urgent care discussed       I discussed the assessment and treatment plan with the patient. The patient was provided an opportunity to ask questions and all were answered. The patient agreed with the plan and demonstrated an understanding of the instructions.   The patient was advised to call back or seek an in-person evaluation/go to the ED if the symptoms worsen or if the condition fails to improve as anticipated.  I provided 15 minutes of face-to-face time during this encounter.   Catalina Antigua, MD [facility] for [employer], [facility]

## 2022-09-05 ENCOUNTER — Ambulatory Visit: Admission: RE | Admit: 2022-09-05 | Payer: Medicaid Other | Source: Ambulatory Visit | Admitting: Radiation Oncology

## 2022-09-05 ENCOUNTER — Telehealth: Payer: Self-pay | Admitting: Radiation Oncology

## 2022-09-05 ENCOUNTER — Ambulatory Visit: Payer: Medicaid Other

## 2022-09-05 DIAGNOSIS — L910 Hypertrophic scar: Secondary | ICD-10-CM

## 2022-09-05 NOTE — Telephone Encounter (Signed)
Pt called asking to r/s today's appt 6/19. Pt states she is having issues with transportation today. Pt was r/s to 6/26 and advised she would have transportation then. Securechat sent to Dr. Joellen Jersey team.

## 2022-09-10 ENCOUNTER — Encounter: Payer: Self-pay | Admitting: Plastic Surgery

## 2022-09-12 ENCOUNTER — Ambulatory Visit: Payer: Medicaid Other

## 2022-09-12 ENCOUNTER — Ambulatory Visit: Payer: Medicaid Other | Admitting: Radiation Oncology

## 2022-09-14 ENCOUNTER — Ambulatory Visit: Payer: Medicaid Other

## 2022-09-14 ENCOUNTER — Ambulatory Visit: Payer: Medicaid Other | Admitting: Radiation Oncology

## 2022-09-14 ENCOUNTER — Telehealth: Payer: Self-pay | Admitting: Radiation Oncology

## 2022-09-14 NOTE — Telephone Encounter (Signed)
Called patient to ask if patient would like to be r/s for consultation w. Dr. Mitzi Hansen. No answer, LVM for a return call.

## 2022-09-18 ENCOUNTER — Telehealth: Payer: Self-pay | Admitting: Radiation Oncology

## 2022-09-18 NOTE — Telephone Encounter (Addendum)
Unable to contact patient to r/s missed consultation w. Dr. Mitzi Hansen on 6/28. Patient has missed multiple appts, will be closing referral until further notice, Dr. Lubertha Basque office notified.

## 2022-09-19 ENCOUNTER — Telehealth: Payer: Self-pay | Admitting: Radiation Oncology

## 2022-09-19 NOTE — Telephone Encounter (Addendum)
Patient called to reschedule 7/19 appointment. Patient also stated she had missed an appointment on 6/19 and asked if the two appointments were the same. Referred to previous notes regarding attempts made to reschedule the appointment. Patient requested to reschedule missed appointment and upcoming appointment. Rescheduled previous appointment and sent message to treatment team regarding request to reschedule upcoming appointment. Verified telephone number and patient is aware of upcoming appointment. Mailed reminder at the request of the patient and verified address. Message sent to referring provider to update.

## 2022-10-02 NOTE — Progress Notes (Signed)
Radiation Oncology         [phone] ________________________________  Name: Elaine Gonzales        MRN: [id number]  Date of Service: 10/03/2022 DOB: Sep 09, 1995  CC:Gerre Scull, NP  [name], MD     REFERRING PHYSICIAN: [name], MD   DIAGNOSIS: The encounter diagnosis was Keloid scar of skin.   HISTORY OF PRESENT ILLNESS: Elaine Gonzales is a 27 y.o. female seen at the request of Dr. 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  The patient had a piercing in this location approximately 2019.  She noted the growth of a keloid scar in this area beginning around 2021.  It has progressively grown and is now discomfort and ringing of her ear.  She has been seen and offered resection in the office with Dr. Ladona Ridgel.  She is seen to discuss adjuvant radiation to reduce risks of local recurrence.     PREVIOUS RADIATION THERAPY: No   PAST MEDICAL HISTORY:  Past Medical History:  Diagnosis Date   Asthma    PCOS (polycystic ovarian syndrome)        PAST SURGICAL HISTORY: Past Surgical History:  Procedure Laterality Date   NO PAST SURGERIES     TYMPANOSTOMY TUBE PLACEMENT       FAMILY HISTORY:  Family History  Problem Relation Age of Onset   Diabetes Mother    Cancer Mother        unsure which kind     SOCIAL HISTORY:  reports that she has never smoked. She has never used smokeless tobacco. She reports current alcohol use. She reports that she does not use drugs. The patient is single. She lives in [place]. She works remotely for a [employer]. She has been struggling with PCOS management but reports her cycles are now easier to anticipate due to using progesterone for withdrawal bleeds.   ALLERGIES: Penicillins and Amoxicillin   MEDICATIONS:  Current Outpatient Medications  Medication Sig Dispense Refill   albuterol (VENTOLIN HFA) 108 (90 Base) MCG/ACT inhaler Inhale 2 puffs into  the lungs every 6 (six) hours as needed for wheezing or shortness of breath. 8 g 3   benzonatate (TESSALON) 100 MG capsule Take 1 capsule (100 mg total) by mouth 3 (three) times daily as needed. 20 capsule 0   brompheniramine-pseudoephedrine-DM 30-2-10 MG/5ML syrup Take 5 mLs by mouth 4 (four) times daily as needed. 120 mL 0   cholecalciferol (VITAMIN D3) 25 MCG (1000 UNIT) tablet Take 1,000 Units by mouth daily.     ibuprofen (ADVIL) 600 MG tablet Take 1 tablet (600 mg total) by mouth every 8 (eight) hours as needed. 30 tablet 0   metFORMIN (GLUCOPHAGE) 500 MG tablet Take one tablet by mouth daily for one week. Then increase to one tablet twice a day for one week.  Then two tablets twice a day. 60 tablet 5   fluticasone (FLONASE) 50 MCG/ACT nasal spray Place 2 sprays into both nostrils daily. (Patient not taking: Reported on 10/03/2022) 16 g 6   letrozole (FEMARA) 2.5 MG tablet Take 1 tablet (2.5 mg total) by mouth daily. Take on days 3 to 7 following a spontaneous menses or progestin-induced bleed. (Patient not taking: Reported on 10/03/2022) 5 tablet 2   lidocaine (XYLOCAINE) 2 % solution Use as directed 15 mLs in the mouth or throat as needed for mouth pain. (Patient not taking: Reported on 10/03/2022) 100 mL 0  medroxyPROGESTERone (PROVERA) 10 MG tablet Take 1 tablet (10 mg total) by mouth daily. Use for ten days (Patient not taking: Reported on 10/03/2022) 10 tablet 2   naproxen (NAPROSYN) 500 MG tablet Take 1 tablet (500 mg total) by mouth 2 (two) times daily with a meal. (Patient not taking: Reported on 10/03/2022) 30 tablet 0   pregabalin (LYRICA) 75 MG capsule Take 1 capsule (75 mg total) by mouth 2 (two) times daily. (Patient not taking: Reported on 10/03/2022) 30 capsule 0   No current facility-administered medications for this encounter.     REVIEW OF SYSTEMS: On review of systems, the patient reports that she is doing okay. She reports itching and sensitivity of the left helix keloid. There  is another keloid in her right helix but this is much smaller and not symptomatic. She is still interested in having this removed as well.       PHYSICAL EXAM:  Wt Readings from Last 3 Encounters:  10/03/22 290 lb 12.8 oz (131.9 kg)  06/06/22 296 lb (134.3 kg)  09/13/21 292 lb 9.6 oz (132.7 kg)   Temp Readings from Last 3 Encounters:  10/03/22 (!) 97.5 F (36.4 C)  09/13/21 (!) 97.1 F (36.2 C) (Temporal)  08/09/21 (!) 97.2 F (36.2 C) (Temporal)   BP Readings from Last 3 Encounters:  10/03/22 114/88  08/29/22 139/82  06/06/22 (!) 142/89   Pulse Readings from Last 3 Encounters:  10/03/22 82  08/29/22 92  06/06/22 83   Pain Assessment Pain Score: 0-No pain/10  In general this is a well appearing African-American female in no acute distress.  She's alert and oriented x4 and appropriate throughout the examination. Cardiopulmonary assessment is negative for acute distress and she exhibits normal effort.  The keloid seen in her left helix is pictured below from Dr. Lubertha Basque original consult note.      She also has a 1 cm keloid in the apex of the right ear helix.   ECOG = 1  0 - Asymptomatic (Fully active, able to carry on all predisease activities without restriction)  1 - Symptomatic but completely ambulatory (Restricted in physically strenuous activity but ambulatory and able to carry out work of a light or sedentary nature. For example, light housework, office work)  2 - Symptomatic, <50% in bed during the day (Ambulatory and capable of all self care but unable to carry out any work activities. Up and about more than 50% of waking hours)  3 - Symptomatic, >50% in bed, but not bedbound (Capable of only limited self-care, confined to bed or chair 50% or more of waking hours)  4 - Bedbound (Completely disabled. Cannot carry on any self-care. Totally confined to bed or chair)  5 - Death   [name] MM, Creech RH, Tormey DC, et al. [phone]). "Toxicity and response criteria of  the [facility] [employer]". Am. Evlyn Clines. Oncol. 5 (6): 649-55    LABORATORY DATA:  Lab Results  Component Value Date   WBC 7.0 08/09/2021   HGB 12.2 08/09/2021   HCT 38.3 08/09/2021   MCV 81.4 08/09/2021   PLT 299.0 08/09/2021   Lab Results  Component Value Date   NA 138 08/09/2021   K 3.8 08/09/2021   CL 103 08/09/2021   CO2 25 08/09/2021   Lab Results  Component Value Date   ALT 19 08/09/2021   AST 20 08/09/2021   ALKPHOS 51 08/09/2021   BILITOT 0.3 08/09/2021      RADIOGRAPHY: No results found.  IMPRESSION/PLAN: 1.         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  Dr. Mitzi Hansen discusses the findings, and considerations of adjuvant radiotherapy to reduce local risks of recurrence of keloid scarring.  He reviewed external radiotherapy to the left helix, and discussed the risks, benefits, short, and long term effects of radiotherapy, as well as the curative intent, and the patient is interested in proceeding. Dr. Mitzi Hansen discusses the delivery and logistics of radiotherapy and anticipates a course of 3-4 fractions of radiotherapy.  We will coordinate the timing of clinical set up for her anticipated resection date of 10/24/2022.  Written consent is obtained and placed in the chart, a copy was provided to the patient. She will also discuss desires for the right helix keloid to be resected at the same time and if so, we would include this in treatment plans as well.  2.         Contraceptive counseling.  The patient does struggle with PCOS and uses high-dose antiestrogen and progesterone therapies.  She is aware of the need to avoid pregnancy during treatment however during treatment and will do a home pregnancy test the night before or morning of her surgery.      In a visit lasting 45 minutes, greater than 50% of the time was spent face to face discussing the patient's condition, in preparation for the discussion, and coordinating the patient's care.   The above  documentation reflects my direct findings during this shared patient visit. Please see the separate note by Dr. Mitzi Hansen on this date for the remainder of the patient's plan of care.    Elaine Gonzales, [facility] [place]   **Disclaimer: This note was dictated with voice recognition software. Similar sounding words can inadvertently be transcribed and this note may contain transcription errors which may not have been corrected upon publication of note.**

## 2022-10-03 ENCOUNTER — Other Ambulatory Visit: Payer: Self-pay

## 2022-10-03 ENCOUNTER — Ambulatory Visit
Admission: RE | Admit: 2022-10-03 | Discharge: 2022-10-03 | Disposition: A | Discharge status: Home / Self Care | Payer: Medicaid Other | Source: Ambulatory Visit | Attending: Radiation Oncology | Admitting: Radiation Oncology

## 2022-10-03 ENCOUNTER — Encounter: Payer: Self-pay | Admitting: Radiation Oncology

## 2022-10-03 VITALS — BP 114/88 | HR 82 | Temp 97.5°F | Resp 18 | Ht 63.0 in | Wt 290.8 lb

## 2022-10-03 DIAGNOSIS — L910 Hypertrophic scar: Secondary | ICD-10-CM

## 2022-10-03 DIAGNOSIS — Z79899 Other long term (current) drug therapy: Secondary | ICD-10-CM | POA: Diagnosis not present

## 2022-10-03 NOTE — Progress Notes (Signed)
Location: Left Helical Rim Keloid   Faelyn A Grandfield presented with a left helical rim keloid  from a piercing that has been there for several years.  She notes that it is growing and is now so large it pulls her ear over.   Past/Anticipated interventions by patient's surgeon/dermatologist for current problematic lesion, if any: Dr. Ladona Ridgel 08/29/2022 -Patient has a keloid which would be amenable to resection in the office.  -We did discuss recurrence rates and I told her that I felt that the best chance of preventing recurrence was with low-dose radiation therapy after the resection.  -She seems interested in this.  -Will schedule her for resection of the keloid and will place a consult to radiation oncology for evaluation.  -Resection is scheduled 10/24/2022     Past Keloids, if any: 1) Location/Histology/Intervention: None 2) Location/Histology/Intervention:  3) Location/Histology/Intervention:        SAFETY ISSUES: Prior radiation? No Pacemaker/ICD? No Possible current pregnancy? Irregular Cycles Is the patient on methotrexate? No   Current Complaints / other details:

## 2022-10-04 ENCOUNTER — Ambulatory Visit: Payer: Medicaid Other | Admitting: Radiation Oncology

## 2022-10-04 ENCOUNTER — Ambulatory Visit: Payer: Medicaid Other | Admitting: Plastic Surgery

## 2022-10-05 ENCOUNTER — Ambulatory Visit: Payer: Medicaid Other | Admitting: Radiation Oncology

## 2022-10-05 ENCOUNTER — Ambulatory Visit: Payer: Medicaid Other

## 2022-10-08 ENCOUNTER — Ambulatory Visit: Payer: Medicaid Other

## 2022-10-09 ENCOUNTER — Ambulatory Visit: Payer: Medicaid Other

## 2022-10-11 ENCOUNTER — Encounter: Payer: Self-pay | Admitting: Plastic Surgery

## 2022-10-18 ENCOUNTER — Encounter: Payer: Self-pay | Admitting: Radiation Oncology

## 2022-10-18 NOTE — Telephone Encounter (Signed)
Thank you. As always, I appreciate you seeing my patients.  Elaine Gonzales

## 2022-10-24 ENCOUNTER — Ambulatory Visit: Payer: Medicaid Other | Admitting: Radiation Oncology

## 2022-10-24 ENCOUNTER — Ambulatory Visit (INDEPENDENT_AMBULATORY_CARE_PROVIDER_SITE_OTHER): Payer: 59 | Admitting: Plastic Surgery

## 2022-10-24 VITALS — BP 145/83 | HR 92

## 2022-10-24 DIAGNOSIS — L910 Hypertrophic scar: Secondary | ICD-10-CM

## 2022-10-24 NOTE — Progress Notes (Signed)
Procedure Note  Preoperative Dx: Left ear keloid x 2  Postoperative Dx: Same  Procedure: Excision of keloid with injection of steroid  Anesthesia: Lidocaine 1% with 1:100,000 epinephrine and 0.25% Sensorcaine   Indication for Procedure: Removal of painful keloid  Description of Procedure: Risks and complications were explained to the patient including the risk of recurrence and change in shape of the ear.  Consent was confirmed and the patient understands the risks and benefits.  The potential complications and alternatives were explained and the patient consents.  The patient expressed understanding the option of not having the procedure and the risks of a scar.  Time out was called and all information was confirmed to be correct.    The area was prepped and drapped.  Local anesthetic was injected in the subcutaneous tissues.  After waiting for the local to take affect the keloid on the superior aspect of the helical rim was addressed first.  An elliptical incision was made around the base of the keloid to remove the keloid while preserving skin to close the defect.  The keloid was excised.  The tissue was sent for pathology.  The remaining skin was trimmed to an appropriate size and then closed with interrupted 5-0 Prolene sutures.  The keloid measured approximately 1.5 cm in length and the closed incision measured approximately 1 cm in length.  Attention was then turned to the keloid on the inside of the helical rim.  This was approximately 5 mm in size.  It was also excised sharply and the skin edges closed with interrupted Prolene sutures.  The surgical wound was then infiltrated with a 50-50 mixture of Kenalog 40 and 1% lidocaine..  After obtaining hemostasis, the surgical wound was closed with interrupted 5-0 Prolene sutures.  The surgical wounds measured 1 cm and 5 mm respectively.  A dressing was applied.  The patient was given instructions on how to care for the area and a follow up  appointment.  Milee tolerated the procedure well and there were no complications. The specimen was sent to pathology.

## 2022-10-25 ENCOUNTER — Ambulatory Visit: Payer: Medicaid Other | Admitting: Radiation Oncology

## 2022-10-25 ENCOUNTER — Ambulatory Visit: Payer: Medicaid Other

## 2022-10-26 ENCOUNTER — Ambulatory Visit: Payer: Medicaid Other

## 2022-10-29 ENCOUNTER — Ambulatory Visit: Payer: Medicaid Other

## 2022-10-31 ENCOUNTER — Encounter: Payer: Self-pay | Admitting: Surgical

## 2022-10-31 ENCOUNTER — Ambulatory Visit (INDEPENDENT_AMBULATORY_CARE_PROVIDER_SITE_OTHER): Payer: 59 | Admitting: Surgical

## 2022-10-31 VITALS — BP 132/84 | HR 84 | Ht 63.0 in | Wt 290.0 lb

## 2022-10-31 DIAGNOSIS — L910 Hypertrophic scar: Secondary | ICD-10-CM

## 2022-10-31 NOTE — Progress Notes (Signed)
Patient is a very pleasant 27 year old female here for follow-up after excision of left ear keloid with Dr. Ladona Ridgel on 10/24/2022.  She is 1 week postop.  The incision was closed with 5-0 Prolene for the skin.  She reports she had a steroid injection the day of surgery.  She reports that she preferred not to do radiation after the keloid excision.  She reports she has been doing well, she is not having any issues at this time.  On exam left ear incision is intact and healing well.  Prolene sutures are noted.  There is no erythema or cellulitic changes noted.  Prolene sutures were removed, patient tolerated this well.  Recommend following up as needed, I did discuss with her that I would discuss her case with Dr. Ladona Ridgel and determine if she needed any additional steroid injections.  We will plan to call her and notify her if she does.  She was agreeable to this plan.

## 2022-11-27 ENCOUNTER — Encounter: Payer: Self-pay | Admitting: Obstetrics and Gynecology

## 2023-01-09 ENCOUNTER — Ambulatory Visit: Payer: 59 | Admitting: Obstetrics and Gynecology

## 2023-04-03 ENCOUNTER — Other Ambulatory Visit: Payer: Self-pay | Admitting: Medical Genetics

## 2023-07-11 ENCOUNTER — Other Ambulatory Visit: Payer: Self-pay | Admitting: Nurse Practitioner

## 2023-07-17 ENCOUNTER — Encounter: Admitting: Nurse Practitioner

## 2023-08-13 ENCOUNTER — Telehealth: Payer: Self-pay | Admitting: Nurse Practitioner

## 2023-08-13 NOTE — Telephone Encounter (Signed)
 08/29/2022 NO SHOW 07/17/2023 NO SHOW  Final warning sent via mail and mychart, pt has not rescheduled

## 2023-08-14 NOTE — Telephone Encounter (Signed)
 Noted

## 2023-11-15 ENCOUNTER — Encounter: Payer: Self-pay | Admitting: Nurse Practitioner

## 2023-12-27 ENCOUNTER — Other Ambulatory Visit: Payer: Self-pay | Admitting: Medical Genetics

## 2023-12-27 DIAGNOSIS — Z006 Encounter for examination for normal comparison and control in clinical research program: Secondary | ICD-10-CM

## 2024-01-28 LAB — GENECONNECT MOLECULAR SCREEN: Genetic Analysis Overall Interpretation: NEGATIVE

## 2024-04-07 ENCOUNTER — Encounter: Admitting: Obstetrics and Gynecology

## 2024-06-11 ENCOUNTER — Ambulatory Visit: Admitting: Obstetrics and Gynecology
